# Patient Record
Sex: Female | Born: 1980 | Race: White | Hispanic: No | Marital: Married | Smoking: Never smoker
Health system: Southern US, Community
[De-identification: ages and names within clinical notes are randomized; demographics above are authoritative.]

## PROBLEM LIST (undated history)

## (undated) ENCOUNTER — Inpatient Hospital Stay (HOSPITAL_COMMUNITY): Payer: Self-pay

## (undated) DIAGNOSIS — R112 Nausea with vomiting, unspecified: Secondary | ICD-10-CM

## (undated) DIAGNOSIS — R87619 Unspecified abnormal cytological findings in specimens from cervix uteri: Secondary | ICD-10-CM

## (undated) DIAGNOSIS — Z9889 Other specified postprocedural states: Secondary | ICD-10-CM

## (undated) DIAGNOSIS — IMO0002 Reserved for concepts with insufficient information to code with codable children: Secondary | ICD-10-CM

## (undated) HISTORY — PX: OTHER SURGICAL HISTORY: SHX169

## (undated) HISTORY — PX: WISDOM TOOTH EXTRACTION: SHX21

---

## 2007-12-24 ENCOUNTER — Emergency Department (HOSPITAL_COMMUNITY): Admission: EM | Admit: 2007-12-24 | Discharge: 2007-12-24 | Payer: Self-pay | Admitting: Emergency Medicine

## 2008-04-14 ENCOUNTER — Emergency Department (HOSPITAL_COMMUNITY): Admission: EM | Admit: 2008-04-14 | Discharge: 2008-04-14 | Payer: Self-pay | Admitting: Emergency Medicine

## 2008-09-06 ENCOUNTER — Emergency Department (HOSPITAL_COMMUNITY): Admission: EM | Admit: 2008-09-06 | Discharge: 2008-09-06 | Payer: Self-pay | Admitting: Family Medicine

## 2008-11-10 ENCOUNTER — Emergency Department (HOSPITAL_COMMUNITY): Admission: EM | Admit: 2008-11-10 | Discharge: 2008-11-10 | Payer: Self-pay | Admitting: Emergency Medicine

## 2009-04-28 ENCOUNTER — Emergency Department (HOSPITAL_COMMUNITY): Admission: EM | Admit: 2009-04-28 | Discharge: 2009-04-28 | Payer: Self-pay | Admitting: Family Medicine

## 2010-03-09 ENCOUNTER — Emergency Department (HOSPITAL_COMMUNITY): Admission: EM | Admit: 2010-03-09 | Discharge: 2010-03-09 | Payer: Self-pay | Admitting: Emergency Medicine

## 2011-03-27 LAB — POCT URINALYSIS DIP (DEVICE)
Hgb urine dipstick: NEGATIVE
Ketones, ur: 15 mg/dL — AB
Nitrite: POSITIVE — AB
Protein, ur: 100 mg/dL — AB
Specific Gravity, Urine: 1.015 (ref 1.005–1.030)
Urobilinogen, UA: 4 mg/dL — ABNORMAL HIGH (ref 0.0–1.0)
pH: 5 (ref 5.0–8.0)

## 2011-09-11 LAB — INFLUENZA A AND B ANTIGEN (CONVERTED LAB)
Inflenza A Ag: NEGATIVE
Influenza B Ag: NEGATIVE

## 2011-10-25 ENCOUNTER — Emergency Department (HOSPITAL_COMMUNITY)
Admission: EM | Admit: 2011-10-25 | Discharge: 2011-10-25 | Disposition: A | Discharge status: Home / Self Care | Payer: 59 | Source: Home / Self Care | Attending: Emergency Medicine | Admitting: Emergency Medicine

## 2011-10-25 ENCOUNTER — Encounter: Payer: Self-pay | Admitting: *Deleted

## 2011-10-25 ENCOUNTER — Emergency Department (INDEPENDENT_AMBULATORY_CARE_PROVIDER_SITE_OTHER): Payer: 59

## 2011-10-25 DIAGNOSIS — R071 Chest pain on breathing: Secondary | ICD-10-CM

## 2011-10-25 DIAGNOSIS — J069 Acute upper respiratory infection, unspecified: Secondary | ICD-10-CM

## 2011-10-25 DIAGNOSIS — R0789 Other chest pain: Secondary | ICD-10-CM

## 2011-10-25 DIAGNOSIS — R05 Cough: Secondary | ICD-10-CM

## 2011-10-25 IMAGING — CR DG CHEST 2V
2 series · 2 of 2 positions shown · non-contrast
Comparison: None.

CLINICAL DATA: Cough

CHEST - 2 VIEW

[view not recorded (1 of 2)]
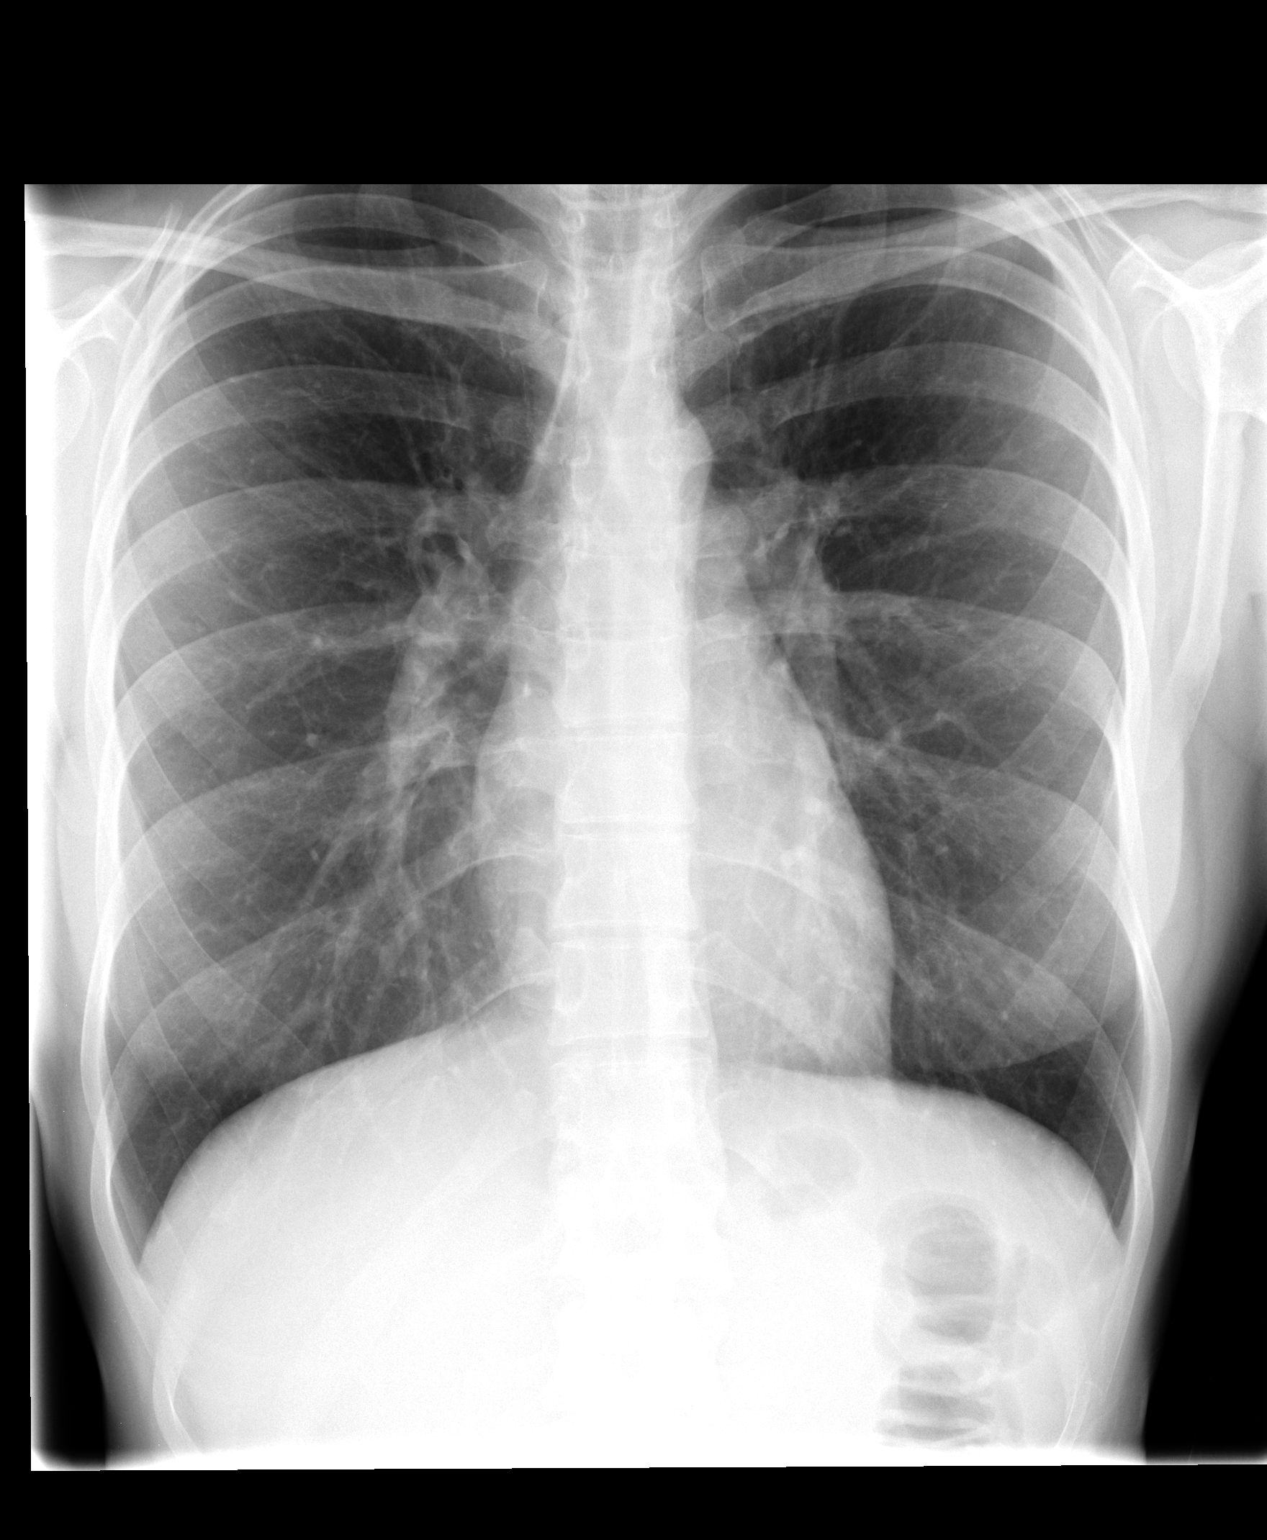

[view not recorded (2 of 2)]
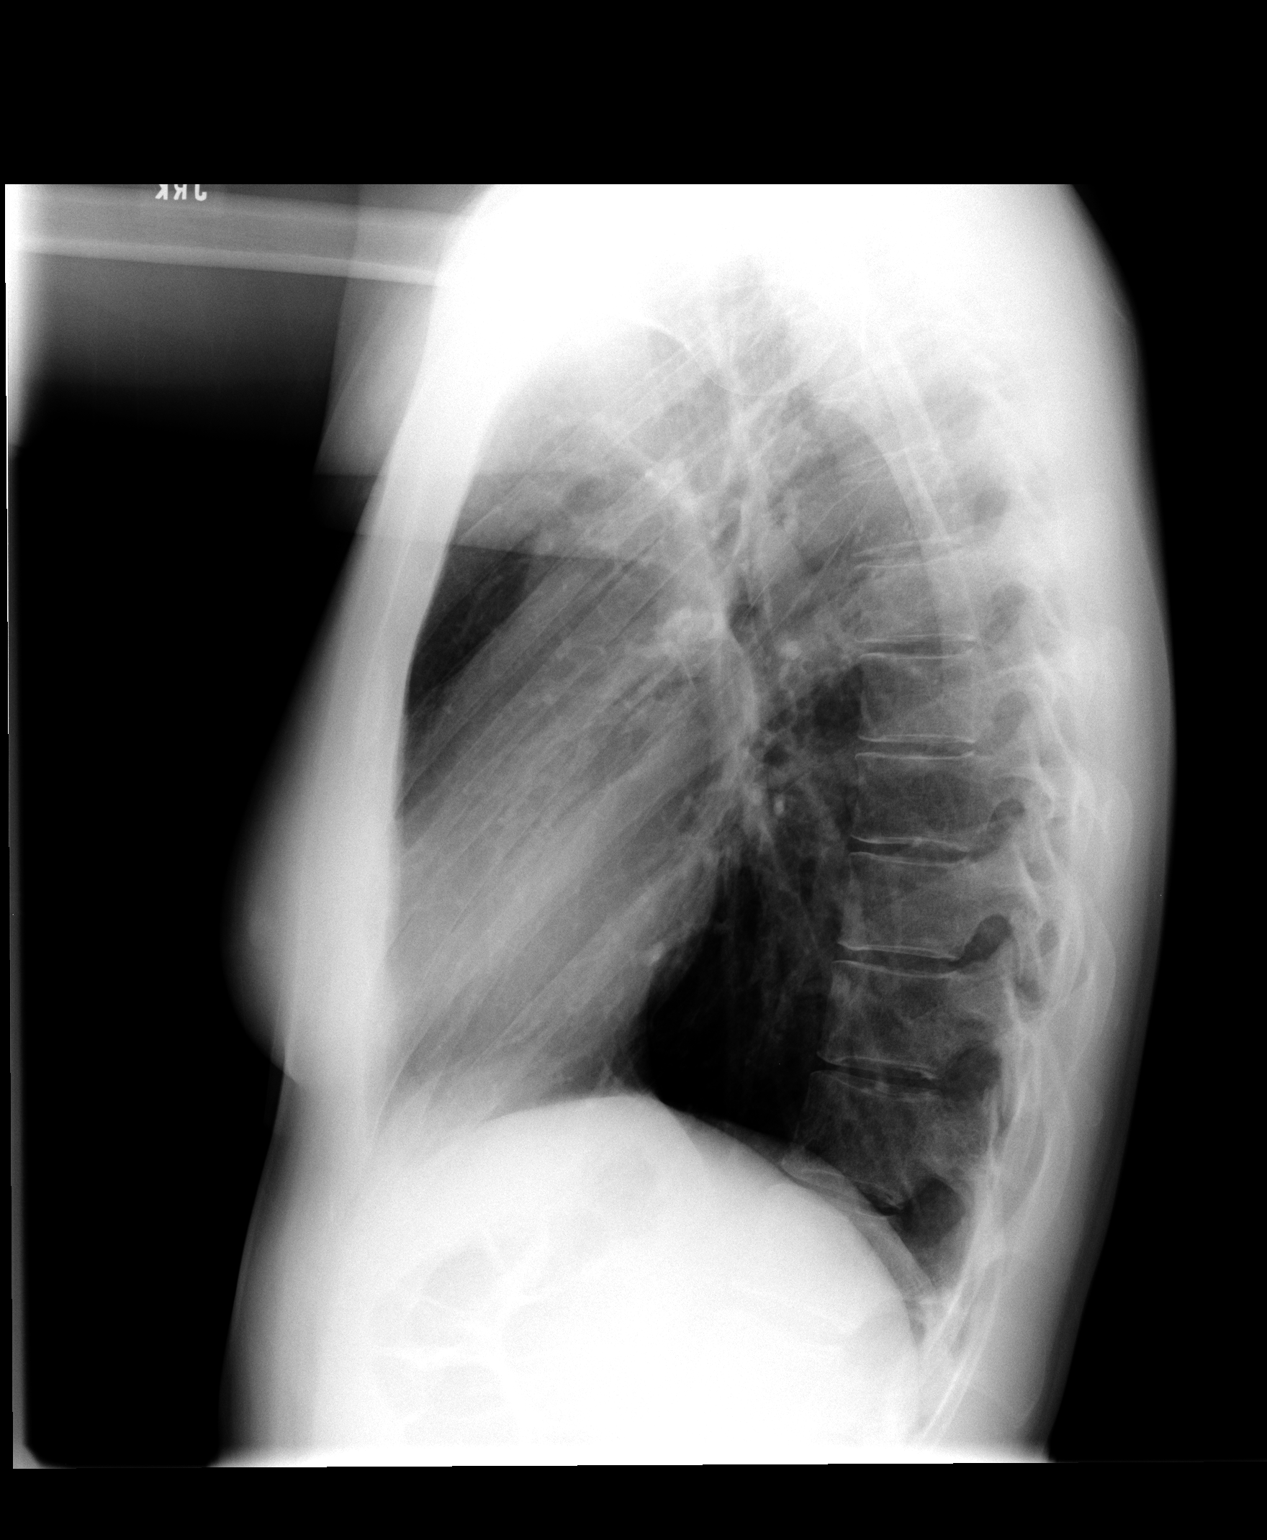

[2 of 2 positions shown; findings below may reference images not displayed]

FINDINGS: Normal heart size.  Hyper aerated and clear lungs.  No
pneumothorax.
IMPRESSION: No active cardiopulmonary disease.

## 2011-10-25 MED ORDER — ACETAMINOPHEN-CODEINE #3 300-30 MG PO TABS: 1.0000 | ORAL_TABLET | Freq: Four times a day (QID) | ORAL | Status: AC | PRN

## 2011-10-25 MED ORDER — AZITHROMYCIN 250 MG PO TABS: 250.0000 mg | ORAL_TABLET | Freq: Every day | ORAL | Status: AC

## 2011-10-25 NOTE — ED Provider Notes (Signed)
History     CSN: [id number] Arrival date & time: 10/25/2011  7:11 PM   First MD Initiated Contact with Patient 10/25/11 1913      Chief Complaint  Patient presents with  . Sore Throat    (Consider location/radiation/quality/duration/timing/severity/associated sxs/prior treatment) Patient is a 29 y.o. female presenting with pharyngitis. The history is provided by the patient. The history is limited by a language barrier.  Sore Throat This is a new problem. The current episode started more than 1 week ago. The problem occurs constantly. The problem has not changed since onset.Associated symptoms include chest pain. Pertinent negatives include no shortness of breath. The symptoms are aggravated by nothing. The symptoms are relieved by nothing. The treatment provided mild relief.    History reviewed. No pertinent past medical history.  History reviewed. No pertinent past surgical history.  Family History  Problem Relation Age of Onset  . Hypertension Father     History  Substance Use Topics  . Smoking status: Never Smoker   . Smokeless tobacco: Not on file  . Alcohol Use: Yes     socially    OB History    Grav Para Term Preterm Abortions TAB SAB Ect Mult Living                  Review of Systems  Constitutional: Positive for fever.  HENT: Positive for congestion. Negative for rhinorrhea.   Respiratory: Positive for cough. Negative for chest tightness, shortness of breath and wheezing.   Cardiovascular: Positive for chest pain.    Allergies  Sulfa antibiotics  Home Medications   Current Outpatient Rx  Name Route Sig Dispense Refill  . ACETAMINOPHEN 325 MG PO TABS Oral Take 650 mg by mouth every 6 (six) hours as needed.      . NYQUIL PO Oral Take by mouth.      . ACETAMINOPHEN-CODEINE #3 300-30 MG PO TABS Oral Take 1-2 tablets by mouth every 6 (six) hours as needed for pain. 15 tablet 0  . AZITHROMYCIN 250 MG PO TABS Oral Take 1 tablet (250 mg total) by mouth  daily. Take first 2 tablets together, then 1 every day until finished. 6 tablet 0    BP 126/78  Pulse 72  Temp(Src) 97.9 F (36.6 C) (Oral)  Resp 18  SpO2 100%  LMP 10/16/2011  Physical Exam  Constitutional: She appears well-developed and well-nourished.  HENT:  Right Ear: Tympanic membrane normal.  Left Ear: Tympanic membrane normal.  Eyes: Pupils are equal, round, and reactive to light.  Neck: Trachea normal and normal range of motion.  Pulmonary/Chest: Effort normal and breath sounds normal. No respiratory distress.    Abdominal: Soft.  Musculoskeletal: Normal range of motion.  Lymphadenopathy:    She has no cervical adenopathy.  Neurological: She is alert.  Skin: Skin is warm.    ED Course  Procedures (including critical care time)  Labs Reviewed - No data to display Dg Chest 2 View  10/25/2011  *RADIOLOGY REPORT*  Clinical Data: Cough  CHEST - 2 VIEW  Comparison: None.  Findings: Normal heart size.  Hyper aerated and clear lungs.  No pneumothorax.  IMPRESSION: No active cardiopulmonary disease.  Original Report Authenticated By: Donavan Burnet, M.D.     1. Upper respiratory infection   2. Cough   3. Chest wall pain       MDM  Cough with pleuritic pain- normal x-rays        Jimmie Molly, MD 10/25/11 2057

## 2011-10-25 NOTE — ED Notes (Signed)
Pt  Reports  sorethroat  Cough  Runny  Nose  Congestion pain l  Side  When takes  Deep  Breath       Cough is    Non productive  At times        Symptoms  X  1  Week

## 2012-02-25 LAB — OB RESULTS CONSOLE HEPATITIS B SURFACE ANTIGEN: Hepatitis B Surface Ag: NEGATIVE

## 2012-02-25 LAB — OB RESULTS CONSOLE ANTIBODY SCREEN: Antibody Screen: NEGATIVE

## 2012-02-25 LAB — OB RESULTS CONSOLE RUBELLA ANTIBODY, IGM: Rubella: IMMUNE

## 2012-02-25 LAB — OB RESULTS CONSOLE ABO/RH

## 2012-02-25 LAB — OB RESULTS CONSOLE HIV ANTIBODY (ROUTINE TESTING): HIV: NONREACTIVE

## 2012-04-05 ENCOUNTER — Inpatient Hospital Stay (HOSPITAL_COMMUNITY): Payer: No Typology Code available for payment source

## 2012-04-05 ENCOUNTER — Inpatient Hospital Stay (HOSPITAL_COMMUNITY)
Admission: AD | Admit: 2012-04-05 | Discharge: 2012-04-05 | Disposition: A | Discharge status: Home / Self Care | Payer: No Typology Code available for payment source | Source: Ambulatory Visit | Attending: Obstetrics and Gynecology | Admitting: Obstetrics and Gynecology

## 2012-04-05 ENCOUNTER — Encounter (HOSPITAL_COMMUNITY): Payer: Self-pay | Admitting: *Deleted

## 2012-04-05 DIAGNOSIS — S3991XA Unspecified injury of abdomen, initial encounter: Secondary | ICD-10-CM

## 2012-04-05 DIAGNOSIS — O9989 Other specified diseases and conditions complicating pregnancy, childbirth and the puerperium: Secondary | ICD-10-CM

## 2012-04-05 DIAGNOSIS — O99891 Other specified diseases and conditions complicating pregnancy: Secondary | ICD-10-CM | POA: Insufficient documentation

## 2012-04-05 DIAGNOSIS — S301XXA Contusion of abdominal wall, initial encounter: Secondary | ICD-10-CM

## 2012-04-05 DIAGNOSIS — R109 Unspecified abdominal pain: Secondary | ICD-10-CM | POA: Insufficient documentation

## 2012-04-05 HISTORY — DX: Unspecified abnormal cytological findings in specimens from cervix uteri: R87.619

## 2012-04-05 HISTORY — DX: Reserved for concepts with insufficient information to code with codable children: IMO0002

## 2012-04-05 LAB — CBC
Hemoglobin: 10.8 g/dL — ABNORMAL LOW (ref 12.0–15.0)
MCH: 29.4 pg (ref 26.0–34.0)
MCHC: 33.2 g/dL (ref 30.0–36.0)
Platelets: 268 10*3/uL (ref 150–400)
RDW: 13.5 % (ref 11.5–15.5)
WBC: 15.5 10*3/uL — ABNORMAL HIGH (ref 4.0–10.5)

## 2012-04-05 LAB — ABO/RH: ABO/RH(D): O POS
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
## 2012-04-05 NOTE — Discharge Instructions (Signed)
Take tylenol as needed for discomfort. Rest and follow up in the office. Return here as needed.  Contusion A contusion is a deep bruise. Contusions happen when an injury causes bleeding under the skin. Signs of bruising include pain, puffiness (swelling), and discolored skin. The contusion may turn blue, purple, or yellow. HOME CARE   Put ice on the injured area.   Put ice in a plastic bag.   Place a towel between your skin and the bag.   Leave the ice on for 15 to 20 minutes, 3 to 4 times a day.   Only take medicine as told by your doctor.   Rest the injured area.   If possible, raise (elevate) the injured area to lessen puffiness.  GET HELP RIGHT AWAY IF:   You have more bruising or puffiness.   You have pain that is getting worse.   Your puffiness or pain is not helped by medicine.  MAKE SURE YOU:   Understand these instructions.   Will watch your condition.   Will get help right away if you are not doing well or get worse.  Document Released: 05/21/2008 Document Revised: 11/22/2011 Document Reviewed: 10/08/2011 [facility] Patient Information 2012 [place], [place].Contusion A contusion is a deep bruise. Contusions happen when an injury causes bleeding under the skin. Signs of bruising include pain, puffiness (swelling), and discolored skin. The contusion may turn blue, purple, or yellow. HOME CARE   Put ice on the injured area.   Put ice in a plastic bag.   Place a towel between your skin and the bag.   Leave the ice on for 15 to 20 minutes, 3 to 4 times a day.   Only take medicine as told by your doctor.   Rest the injured area.   If possible, raise (elevate) the injured area to lessen puffiness.  GET HELP RIGHT AWAY IF:   You have more bruising or puffiness.   You have pain that is getting worse.   Your puffiness or pain is not helped by medicine.  MAKE SURE YOU:   Understand these instructions.   Will watch your condition.   Will get help right away  if you are not doing well or get worse.  Document Released: 05/21/2008 Document Revised: 11/22/2011 Document Reviewed: 10/08/2011 [facility] Patient Information 2012 [place], [place].
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
## 2012-08-20 ENCOUNTER — Ambulatory Visit (HOSPITAL_COMMUNITY)
Admission: RE | Admit: 2012-08-20 | Discharge: 2012-08-20 | Disposition: A | Discharge status: Home / Self Care | Payer: 59 | Source: Ambulatory Visit | Attending: Obstetrics and Gynecology | Admitting: Obstetrics and Gynecology

## 2012-08-20 ENCOUNTER — Other Ambulatory Visit (HOSPITAL_COMMUNITY): Payer: Self-pay | Admitting: Obstetrics and Gynecology

## 2012-08-20 DIAGNOSIS — O4290 Premature rupture of membranes, unspecified as to length of time between rupture and onset of labor, unspecified weeks of gestation: Secondary | ICD-10-CM | POA: Insufficient documentation

## 2012-08-20 DIAGNOSIS — O321XX0 Maternal care for breech presentation, not applicable or unspecified: Secondary | ICD-10-CM | POA: Insufficient documentation

## 2012-08-28 ENCOUNTER — Encounter (HOSPITAL_COMMUNITY): Payer: Self-pay | Admitting: *Deleted

## 2012-08-28 ENCOUNTER — Inpatient Hospital Stay (HOSPITAL_COMMUNITY): Payer: 59

## 2012-08-28 ENCOUNTER — Inpatient Hospital Stay (HOSPITAL_COMMUNITY)
Admission: AD | Admit: 2012-08-28 | Discharge: 2012-08-28 | Disposition: A | Discharge status: Home / Self Care | Payer: 59 | Source: Ambulatory Visit | Attending: Obstetrics and Gynecology | Admitting: Obstetrics and Gynecology

## 2012-08-28 DIAGNOSIS — O99891 Other specified diseases and conditions complicating pregnancy: Secondary | ICD-10-CM | POA: Insufficient documentation

## 2012-08-28 DIAGNOSIS — R109 Unspecified abdominal pain: Secondary | ICD-10-CM | POA: Insufficient documentation

## 2012-08-28 DIAGNOSIS — N949 Unspecified condition associated with female genital organs and menstrual cycle: Secondary | ICD-10-CM | POA: Insufficient documentation

## 2012-08-28 DIAGNOSIS — O4690 Antepartum hemorrhage, unspecified, unspecified trimester: Secondary | ICD-10-CM

## 2012-08-28 NOTE — MAU Note (Signed)
Patient states the baby is breech and will have a cesarean section in 2 weeks.

## 2012-08-28 NOTE — MAU Provider Note (Signed)
  History     CSN: [id number]  Arrival date and time: 08/28/12 4098   First Provider Initiated Contact with Patient 08/28/12 1034      Chief Complaint  Patient presents with  . Vaginal Bleeding   HPI 31 y.o. G1P0000 at [redacted]w[redacted]d with heavy brown vaginal discharge x 1 day. No bright red bleeding. Mild cramping, no other pain. Cervical exam in office yesterday, 1 cm. + fetal movement.    Past Medical History  Diagnosis Date  . Asthma   . Abnormal Pap smear     Past Surgical History  Procedure Date  . Wisdom tooth extraction     Family History  Problem Relation Age of Onset  . Hypertension Father     History  Substance Use Topics  . Smoking status: Never Smoker   . Smokeless tobacco: Not on file  . Alcohol Use: Yes     socially    Allergies:  Allergies  Allergen Reactions  . Sulfa Antibiotics Other (See Comments)    Reaction: childhood    Prescriptions prior to admission  Medication Sig Dispense Refill  . calcium carbonate (TUMS - DOSED IN MG ELEMENTAL CALCIUM) 500 MG chewable tablet Chew 1 tablet by mouth as needed. heartburn      . OVER THE COUNTER MEDICATION Take 1 tablet by mouth as needed. Patient takes over the counter stool softener as needed for constipation      . Prenatal Vit-Fe Fumarate-FA (PRENATAL MULTIVITAMIN) TABS Take 1 tablet by mouth every morning.      Marland Kitchen acetaminophen (TYLENOL) 500 MG tablet Take 1,000 mg by mouth every 6 (six) hours as needed. Takes for pain        Review of Systems  Constitutional: Negative.   Respiratory: Negative.   Cardiovascular: Negative.   Gastrointestinal: Negative for nausea, vomiting, abdominal pain, diarrhea and constipation.  Genitourinary: Negative for dysuria, urgency, frequency, hematuria and flank pain.       + for vaginal discharge/bleeding   Musculoskeletal: Negative.   Neurological: Negative.   Psychiatric/Behavioral: Negative.    Physical Exam   Blood pressure 126/80, temperature 98.7 F (37.1 C),  temperature source Oral, resp. rate 16, height 5' 9.5" (1.765 m), weight 169 lb 9.6 oz (76.93 kg), last menstrual period 10/16/2011, SpO2 100.00%.  Physical Exam  Nursing note and vitals reviewed. Constitutional: She is oriented to person, place, and time. She appears well-developed and well-nourished. No distress.  Cardiovascular: Normal rate.   Respiratory: Effort normal.  GI: Soft. There is no tenderness.  Genitourinary: Vaginal discharge (moderate brown) found.       SVE: 1/50/high  Musculoskeletal: Normal range of motion.  Neurological: She is alert and oriented to person, place, and time.  Skin: Skin is warm and dry.  Psychiatric: She has a normal mood and affect.   EFM reactive, TOCO: UC q2-3 min, irritability  MAU Course  Procedures  U/s: no evidence of abruption, AFV wnl, breech  Assessment and Plan  31 y.o. G1P0000 at [redacted]w[redacted]d with brown vaginal discharge No evidence of abruption, stable maternal fetal status Follow up as scheduled or sooner PRN, precautions rev'd  Sanvi Ehler 08/28/2012, 10:42 AM

## 2012-08-28 NOTE — MAU Note (Signed)
Patient states she was seen in the office 9-11 and had a cervical check and was 1 cm. Started having some dark bleeding that has continued today. Having some mild cramping, reports good fetal movement.

## 2012-09-01 ENCOUNTER — Other Ambulatory Visit: Payer: Self-pay | Admitting: Obstetrics and Gynecology

## 2012-09-01 ENCOUNTER — Encounter (HOSPITAL_COMMUNITY): Payer: Self-pay

## 2012-09-04 ENCOUNTER — Encounter (HOSPITAL_COMMUNITY): Payer: Self-pay

## 2012-09-04 ENCOUNTER — Encounter (HOSPITAL_COMMUNITY)
Admission: RE | Admit: 2012-09-04 | Discharge: 2012-09-04 | Disposition: A | Discharge status: Home / Self Care | Payer: 59 | Source: Ambulatory Visit | Attending: Obstetrics and Gynecology | Admitting: Obstetrics and Gynecology

## 2012-09-04 LAB — CBC
Hemoglobin: 12.4 g/dL (ref 12.0–15.0)
MCH: 30.2 pg (ref 26.0–34.0)
MCV: 90 fL (ref 78.0–100.0)
Platelets: 218 10*3/uL (ref 150–400)
RBC: 4.11 MIL/uL (ref 3.87–5.11)
WBC: 12.8 10*3/uL — ABNORMAL HIGH (ref 4.0–10.5)

## 2012-09-04 NOTE — Patient Instructions (Addendum)
20 Stacy Goodman  09/04/2012   Your procedure is scheduled on:  09/12/12  Enter through the Main Entrance of [facility] at 10 AM.  Pick up the phone at the desk and dial [phone].   Call this number if you have problems the morning of surgery: [phone]   Remember:   Do not eat food:After Midnight.  Do not drink clear liquids: After Midnight.  Take these medicines the morning of surgery with A SIP OF WATER: NA   Do not wear jewelry, make-up or nail polish.  Do not wear lotions, powders, or perfumes. You may wear deodorant.  Do not shave 48 hours prior to surgery.  Do not bring valuables to the hospital.  Contacts, dentures or bridgework may not be worn into surgery.  Leave suitcase in the car. After surgery it may be brought to your room.  For patients admitted to the hospital, checkout time is 11:00 AM the day of discharge.   Patients discharged the day of surgery will not be allowed to drive home.  Name and phone number of your driver: NA  Special Instructions: Shower using CHG 2 nights before surgery and the night before surgery.  If you shower the day of surgery use CHG.  Use special wash - you have one bottle of CHG for all showers.  You should use approximately 1/3 of the bottle for each shower.   Please read over the following fact sheets that you were given: MRSA Information

## 2012-09-05 ENCOUNTER — Inpatient Hospital Stay (HOSPITAL_COMMUNITY): Admission: RE | Admit: 2012-09-05 | Payer: 59 | Source: Ambulatory Visit

## 2012-09-12 ENCOUNTER — Encounter (HOSPITAL_COMMUNITY)
Admission: RE | Disposition: A | Discharge status: Home / Self Care | Payer: Self-pay | Source: Ambulatory Visit | Attending: Obstetrics and Gynecology

## 2012-09-12 ENCOUNTER — Encounter (HOSPITAL_COMMUNITY): Payer: Self-pay | Admitting: General Surgery

## 2012-09-12 ENCOUNTER — Inpatient Hospital Stay (HOSPITAL_COMMUNITY): Payer: 59

## 2012-09-12 ENCOUNTER — Inpatient Hospital Stay (HOSPITAL_COMMUNITY)
Admission: RE | Admit: 2012-09-12 | Discharge: 2012-09-15 | DRG: 766 | Disposition: A | Discharge status: Home / Self Care | Payer: 59 | Source: Ambulatory Visit | Attending: Obstetrics and Gynecology | Admitting: Obstetrics and Gynecology

## 2012-09-12 ENCOUNTER — Encounter (HOSPITAL_COMMUNITY): Payer: Self-pay | Admitting: Anesthesiology

## 2012-09-12 ENCOUNTER — Encounter (HOSPITAL_COMMUNITY): Payer: Self-pay

## 2012-09-12 DIAGNOSIS — Z3480 Encounter for supervision of other normal pregnancy, unspecified trimester: Secondary | ICD-10-CM

## 2012-09-12 DIAGNOSIS — O321XX0 Maternal care for breech presentation, not applicable or unspecified: Principal | ICD-10-CM | POA: Diagnosis present

## 2012-09-12 HISTORY — DX: Other specified postprocedural states: R11.2

## 2012-09-12 HISTORY — DX: Other specified postprocedural states: Z98.890

## 2012-09-12 SURGERY — Surgical Case
Anesthesia: Spinal | Site: Abdomen | Wound class: Clean Contaminated

## 2012-09-12 MED ORDER — KETOROLAC TROMETHAMINE 30 MG/ML IJ SOLN
30.0000 mg | Freq: Four times a day (QID) | INTRAMUSCULAR | Status: AC | PRN
  Administered 2012-09-12: 30 mg via INTRAVENOUS
  Filled 2012-09-12: qty 1

## 2012-09-12 MED ORDER — TETANUS-DIPHTH-ACELL PERTUSSIS 5-2.5-18.5 LF-MCG/0.5 IM SUSP: 0.5000 mL | Freq: Once | INTRAMUSCULAR | Status: DC

## 2012-09-12 MED ORDER — SCOPOLAMINE 1 MG/3DAYS TD PT72
1.0000 | MEDICATED_PATCH | Freq: Once | TRANSDERMAL | Status: DC
  Filled 2012-09-12: qty 1

## 2012-09-12 MED ORDER — OXYTOCIN 40 UNITS IN LACTATED RINGERS INFUSION - SIMPLE MED: 62.5000 mL/h | INTRAVENOUS | Status: AC

## 2012-09-12 MED ORDER — CEFAZOLIN SODIUM-DEXTROSE 2-3 GM-% IV SOLR
2.0000 g | INTRAVENOUS | Status: AC
  Administered 2012-09-12: 2 g via INTRAVENOUS

## 2012-09-12 MED ORDER — FENTANYL CITRATE 0.05 MG/ML IJ SOLN
INTRAMUSCULAR | Status: AC
  Filled 2012-09-12: qty 2

## 2012-09-12 MED ORDER — EPHEDRINE 5 MG/ML INJ
INTRAVENOUS | Status: AC
  Filled 2012-09-12: qty 10

## 2012-09-12 MED ORDER — ONDANSETRON HCL 4 MG/2ML IJ SOLN
INTRAMUSCULAR | Status: AC
  Filled 2012-09-12: qty 2

## 2012-09-12 MED ORDER — HYDROMORPHONE HCL PF 1 MG/ML IJ SOLN: 0.2500 mg | INTRAMUSCULAR | Status: DC | PRN

## 2012-09-12 MED ORDER — KETOROLAC TROMETHAMINE 60 MG/2ML IM SOLN
60.0000 mg | Freq: Once | INTRAMUSCULAR | Status: AC | PRN
  Administered 2012-09-12: 60 mg via INTRAMUSCULAR

## 2012-09-12 MED ORDER — LACTATED RINGERS IV SOLN
INTRAVENOUS | Status: DC | PRN
  Administered 2012-09-12 (×2): via INTRAVENOUS

## 2012-09-12 MED ORDER — SENNOSIDES-DOCUSATE SODIUM 8.6-50 MG PO TABS
2.0000 | ORAL_TABLET | Freq: Every day | ORAL | Status: DC
  Administered 2012-09-12 – 2012-09-14 (×3): 2 via ORAL

## 2012-09-12 MED ORDER — PHENYLEPHRINE 40 MCG/ML (10ML) SYRINGE FOR IV PUSH (FOR BLOOD PRESSURE SUPPORT)
PREFILLED_SYRINGE | INTRAVENOUS | Status: AC
  Filled 2012-09-12: qty 5

## 2012-09-12 MED ORDER — KETOROLAC TROMETHAMINE 30 MG/ML IJ SOLN: 30.0000 mg | Freq: Four times a day (QID) | INTRAMUSCULAR | Status: AC | PRN

## 2012-09-12 MED ORDER — PRENATAL MULTIVITAMIN CH
1.0000 | ORAL_TABLET | Freq: Every morning | ORAL | Status: DC
  Administered 2012-09-13 – 2012-09-15 (×3): 1 via ORAL
  Filled 2012-09-12 (×4): qty 1

## 2012-09-12 MED ORDER — DIPHENHYDRAMINE HCL 25 MG PO CAPS: 25.0000 mg | ORAL_CAPSULE | ORAL | Status: DC | PRN

## 2012-09-12 MED ORDER — NALBUPHINE HCL 10 MG/ML IJ SOLN
5.0000 mg | INTRAMUSCULAR | Status: DC | PRN
  Filled 2012-09-12: qty 1

## 2012-09-12 MED ORDER — SODIUM CHLORIDE 0.9 % IJ SOLN: 3.0000 mL | INTRAMUSCULAR | Status: DC | PRN

## 2012-09-12 MED ORDER — SCOPOLAMINE 1 MG/3DAYS TD PT72
MEDICATED_PATCH | TRANSDERMAL | Status: AC
  Administered 2012-09-12: 1.5 mg via INTRADERMAL
  Filled 2012-09-12: qty 1

## 2012-09-12 MED ORDER — ONDANSETRON HCL 4 MG/2ML IJ SOLN: 4.0000 mg | Freq: Three times a day (TID) | INTRAMUSCULAR | Status: DC | PRN

## 2012-09-12 MED ORDER — NALOXONE HCL 0.4 MG/ML IJ SOLN: 0.4000 mg | INTRAMUSCULAR | Status: DC | PRN

## 2012-09-12 MED ORDER — OXYTOCIN 10 UNIT/ML IJ SOLN
INTRAMUSCULAR | Status: AC
  Filled 2012-09-12: qty 1

## 2012-09-12 MED ORDER — ONDANSETRON HCL 4 MG/2ML IJ SOLN
INTRAMUSCULAR | Status: DC | PRN
  Administered 2012-09-12: 4 mg via INTRAVENOUS

## 2012-09-12 MED ORDER — PHENYLEPHRINE 40 MCG/ML (10ML) SYRINGE FOR IV PUSH (FOR BLOOD PRESSURE SUPPORT)
PREFILLED_SYRINGE | INTRAVENOUS | Status: AC
Start: 2012-09-12 — End: 2012-09-12
  Filled 2012-09-12: qty 5

## 2012-09-12 MED ORDER — CEFAZOLIN SODIUM-DEXTROSE 2-3 GM-% IV SOLR
INTRAVENOUS | Status: AC
  Filled 2012-09-12: qty 50

## 2012-09-12 MED ORDER — KETOROLAC TROMETHAMINE 60 MG/2ML IM SOLN
INTRAMUSCULAR | Status: AC
  Administered 2012-09-12: 60 mg via INTRAMUSCULAR
  Filled 2012-09-12: qty 2

## 2012-09-12 MED ORDER — METOCLOPRAMIDE HCL 5 MG/ML IJ SOLN: 10.0000 mg | Freq: Three times a day (TID) | INTRAMUSCULAR | Status: DC | PRN

## 2012-09-12 MED ORDER — TETANUS-DIPHTH-ACELL PERTUSSIS 5-2.5-18.5 LF-MCG/0.5 IM SUSP
0.5000 mL | Freq: Once | INTRAMUSCULAR | Status: AC
  Administered 2012-09-14: 0.5 mL via INTRAMUSCULAR
  Filled 2012-09-12: qty 0.5

## 2012-09-12 MED ORDER — DIBUCAINE 1 % RE OINT: 1.0000 "application " | TOPICAL_OINTMENT | RECTAL | Status: DC | PRN

## 2012-09-12 MED ORDER — MEPERIDINE HCL 25 MG/ML IJ SOLN: 6.2500 mg | INTRAMUSCULAR | Status: DC | PRN

## 2012-09-12 MED ORDER — IBUPROFEN 600 MG PO TABS
600.0000 mg | ORAL_TABLET | Freq: Four times a day (QID) | ORAL | Status: DC
  Administered 2012-09-13 – 2012-09-15 (×9): 600 mg via ORAL

## 2012-09-12 MED ORDER — OXYTOCIN 10 UNIT/ML IJ SOLN
40.0000 [IU] | INTRAVENOUS | Status: DC | PRN
  Administered 2012-09-12: 40 [IU] via INTRAVENOUS

## 2012-09-12 MED ORDER — IBUPROFEN 600 MG PO TABS
600.0000 mg | ORAL_TABLET | Freq: Four times a day (QID) | ORAL | Status: DC | PRN
  Filled 2012-09-12 (×9): qty 1

## 2012-09-12 MED ORDER — PHENYLEPHRINE HCL 10 MG/ML IJ SOLN
INTRAMUSCULAR | Status: DC | PRN
  Administered 2012-09-12 (×2): 80 ug via INTRAVENOUS

## 2012-09-12 MED ORDER — SODIUM CHLORIDE 0.9 % IV SOLN
1.0000 ug/kg/h | INTRAVENOUS | Status: DC | PRN
  Filled 2012-09-12: qty 2.5

## 2012-09-12 MED ORDER — HYDROMORPHONE HCL PF 1 MG/ML IJ SOLN
1.0000 mg | Freq: Once | INTRAMUSCULAR | Status: AC
  Administered 2012-09-12: 1 mg via INTRAVENOUS
  Filled 2012-09-12: qty 1

## 2012-09-12 MED ORDER — MENTHOL 3 MG MT LOZG: 1.0000 | LOZENGE | OROMUCOSAL | Status: DC | PRN

## 2012-09-12 MED ORDER — ONDANSETRON HCL 4 MG PO TABS: 4.0000 mg | ORAL_TABLET | ORAL | Status: DC | PRN

## 2012-09-12 MED ORDER — WITCH HAZEL-GLYCERIN EX PADS: 1.0000 "application " | MEDICATED_PAD | CUTANEOUS | Status: DC | PRN

## 2012-09-12 MED ORDER — LACTATED RINGERS IV SOLN
INTRAVENOUS | Status: DC
  Administered 2012-09-12: 21:00:00 via INTRAVENOUS

## 2012-09-12 MED ORDER — INFLUENZA VIRUS VACC SPLIT PF IM SUSP
0.5000 mL | INTRAMUSCULAR | Status: AC
Start: 2012-09-13 — End: 2012-09-14

## 2012-09-12 MED ORDER — LANOLIN HYDROUS EX OINT: 1.0000 "application " | TOPICAL_OINTMENT | CUTANEOUS | Status: DC | PRN

## 2012-09-12 MED ORDER — ZOLPIDEM TARTRATE 5 MG PO TABS: 5.0000 mg | ORAL_TABLET | Freq: Every evening | ORAL | Status: DC | PRN

## 2012-09-12 MED ORDER — DIPHENHYDRAMINE HCL 25 MG PO CAPS: 25.0000 mg | ORAL_CAPSULE | Freq: Four times a day (QID) | ORAL | Status: DC | PRN

## 2012-09-12 MED ORDER — MORPHINE SULFATE 0.5 MG/ML IJ SOLN
INTRAMUSCULAR | Status: AC
  Filled 2012-09-12: qty 10

## 2012-09-12 MED ORDER — LACTATED RINGERS IV SOLN
INTRAVENOUS | Status: DC | PRN
  Administered 2012-09-12: 12:00:00 via INTRAVENOUS

## 2012-09-12 MED ORDER — LACTATED RINGERS IV SOLN
INTRAVENOUS | Status: DC
  Administered 2012-09-12: 125 mL/h via INTRAVENOUS

## 2012-09-12 MED ORDER — ONDANSETRON HCL 4 MG/2ML IJ SOLN: 4.0000 mg | INTRAMUSCULAR | Status: DC | PRN

## 2012-09-12 MED ORDER — SIMETHICONE 80 MG PO CHEW: 80.0000 mg | CHEWABLE_TABLET | ORAL | Status: DC | PRN

## 2012-09-12 MED ORDER — SIMETHICONE 80 MG PO CHEW
80.0000 mg | CHEWABLE_TABLET | Freq: Three times a day (TID) | ORAL | Status: DC
  Administered 2012-09-12 – 2012-09-15 (×10): 80 mg via ORAL

## 2012-09-12 MED ORDER — PRENATAL MULTIVITAMIN CH: 1.0000 | ORAL_TABLET | Freq: Every day | ORAL | Status: DC

## 2012-09-12 MED ORDER — DIPHENHYDRAMINE HCL 50 MG/ML IJ SOLN: 12.5000 mg | INTRAMUSCULAR | Status: DC | PRN

## 2012-09-12 MED ORDER — OXYCODONE-ACETAMINOPHEN 5-325 MG PO TABS
1.0000 | ORAL_TABLET | ORAL | Status: DC | PRN
Start: 2012-09-12 — End: 2012-09-15
  Administered 2012-09-13 (×3): 1 via ORAL
  Administered 2012-09-13 (×2): 2 via ORAL
  Administered 2012-09-13 (×2): 1 via ORAL
  Administered 2012-09-14 – 2012-09-15 (×9): 2 via ORAL
  Filled 2012-09-12: qty 1
  Filled 2012-09-12: qty 2
  Filled 2012-09-12: qty 1
  Filled 2012-09-12 (×7): qty 2
  Filled 2012-09-12: qty 1
  Filled 2012-09-12 (×4): qty 2

## 2012-09-12 MED ORDER — GLYCOPYRROLATE 0.2 MG/ML IJ SOLN
INTRAMUSCULAR | Status: DC | PRN
  Administered 2012-09-12: 0.2 mg via INTRAVENOUS

## 2012-09-12 MED ORDER — DIPHENHYDRAMINE HCL 50 MG/ML IJ SOLN: 25.0000 mg | INTRAMUSCULAR | Status: DC | PRN

## 2012-09-12 SURGICAL SUPPLY — 31 items
CLOTH BEACON ORANGE TIMEOUT ST (SAFETY) ×2 IMPLANT
DRAPE SURG 17X23 STRL (DRAPES) ×2 IMPLANT
DRESSING TELFA 8X3 (GAUZE/BANDAGES/DRESSINGS) ×2 IMPLANT
DRSG COVADERM 4X10 (GAUZE/BANDAGES/DRESSINGS) ×2 IMPLANT
DURAPREP 26ML APPLICATOR (WOUND CARE) ×2 IMPLANT
ELECT REM PT RETURN 9FT ADLT (ELECTROSURGICAL) ×2
ELECTRODE REM PT RTRN 9FT ADLT (ELECTROSURGICAL) ×1 IMPLANT
EXTRACTOR VACUUM M CUP 4 TUBE (SUCTIONS) IMPLANT
GAUZE SPONGE 4X4 12PLY STRL LF (GAUZE/BANDAGES/DRESSINGS) ×4 IMPLANT
GLOVE BIO SURGEON STRL SZ 6.5 (GLOVE) ×2 IMPLANT
GLOVE BIOGEL PI IND STRL 6.5 (GLOVE) ×2 IMPLANT
GLOVE BIOGEL PI INDICATOR 6.5 (GLOVE) ×2
GOWN PREVENTION PLUS LG XLONG (DISPOSABLE) ×4 IMPLANT
KIT ABG SYR 3ML LUER SLIP (SYRINGE) IMPLANT
NEEDLE HYPO 25X5/8 SAFETYGLIDE (NEEDLE) IMPLANT
NS IRRIG 1000ML POUR BTL (IV SOLUTION) ×2 IMPLANT
PACK C SECTION WH (CUSTOM PROCEDURE TRAY) ×2 IMPLANT
PAD ABD 7.5X8 STRL (GAUZE/BANDAGES/DRESSINGS) IMPLANT
PAD OB MATERNITY 4.3X12.25 (PERSONAL CARE ITEMS) IMPLANT
RTRCTR C-SECT PINK 25CM LRG (MISCELLANEOUS) ×2 IMPLANT
SLEEVE SCD COMPRESS KNEE MED (MISCELLANEOUS) IMPLANT
STAPLER VISISTAT 35W (STAPLE) IMPLANT
SUT MON AB 2-0 CT1 27 (SUTURE) ×2 IMPLANT
SUT MON AB 4-0 PS1 27 (SUTURE) IMPLANT
SUT PLAIN 2 0 XLH (SUTURE) IMPLANT
SUT VIC AB 0 CTX 36 (SUTURE) ×4
SUT VIC AB 0 CTX36XBRD ANBCTRL (SUTURE) ×4 IMPLANT
SUT VIC AB 4-0 KS 27 (SUTURE) IMPLANT
TOWEL OR 17X24 6PK STRL BLUE (TOWEL DISPOSABLE) ×4 IMPLANT
TRAY FOLEY CATH 14FR (SET/KITS/TRAYS/PACK) ×2 IMPLANT
WATER STERILE IRR 1000ML POUR (IV SOLUTION) ×2 IMPLANT

## 2012-09-12 NOTE — Anesthesia Procedure Notes (Signed)
Spinal  Patient location during procedure: OR Preanesthetic Checklist Completed: patient identified, site marked, surgical consent, pre-op evaluation, timeout performed, IV checked, risks and benefits discussed and monitors and equipment checked Spinal Block Patient position: sitting Prep: DuraPrep Patient monitoring: heart rate, cardiac monitor, continuous pulse ox and blood pressure Approach: midline Location: L3-4 Injection technique: single-shot Needle Needle type: Sprotte  Needle gauge: 24 G Needle length: 9 cm Assessment Sensory level: T4 Additional Notes Spinal Dosage in OR  Bupivicaine ml       1.9 PFMS04   mcg        150 Fentanyl mcg            25    

## 2012-09-12 NOTE — Anesthesia Preprocedure Evaluation (Signed)
Anesthesia Evaluation  Patient identified by MRN, date of birth, ID band Patient awake    Reviewed: Allergy & Precautions, H&P , Patient's Chart, lab work & pertinent test results  Airway Mallampati: II TM Distance: >3 FB Neck ROM: full    Dental No notable dental hx.    Pulmonary asthma (rare inhaler use) ,  breath sounds clear to auscultation  Pulmonary exam normal       Cardiovascular Exercise Tolerance: Good Rhythm:regular Rate:Normal     Neuro/Psych    GI/Hepatic   Endo/Other    Renal/GU      Musculoskeletal   Abdominal   Peds  Hematology   Anesthesia Other Findings   Reproductive/Obstetrics                           Anesthesia Physical Anesthesia Plan  ASA: II  Anesthesia Plan: Spinal   Post-op Pain Management:    Induction:   Airway Management Planned:   Additional Equipment:   Intra-op Plan:   Post-operative Plan:   Informed Consent: I have reviewed the patients History and Physical, chart, labs and discussed the procedure including the risks, benefits and alternatives for the proposed anesthesia with the patient or authorized representative who has indicated his/her understanding and acceptance.   Dental Advisory Given  Plan Discussed with: CRNA  Anesthesia Plan Comments: (Lab work confirmed with CRNA in room. Platelets okay. Discussed spinal anesthetic, and patient consents to the procedure:  included risk of possible headache,backache, failed block, allergic reaction, and nerve injury. This patient was asked if she had any questions or concerns before the procedure started. )        Anesthesia Quick Evaluation

## 2012-09-12 NOTE — Anesthesia Postprocedure Evaluation (Signed)
  Anesthesia Post-op Note  Patient: Stacy Goodman  Procedure(s) Performed: Procedure(s) (LRB) with comments: CESAREAN SECTION (N/A)  Patient Location: Mother/Baby  Anesthesia Type: Spinal  Level of Consciousness: awake  Airway and Oxygen Therapy: Patient Spontanous Breathing  Post-op Pain: mild  Post-op Assessment: Patient's Cardiovascular Status Stable and Respiratory Function Stable  Post-op Vital Signs: stable  Complications: No apparent anesthesia complications

## 2012-09-12 NOTE — Transfer of Care (Signed)
Immediate Anesthesia Transfer of Care Note  Patient: Stacy Goodman  Procedure(s) Performed: Procedure(s) (LRB) with comments: CESAREAN SECTION (N/A)  Patient Location: PACU  Anesthesia Type: Spinal  Level of Consciousness: awake, alert  and oriented  Airway & Oxygen Therapy: Patient Spontanous Breathing  Post-op Assessment: Report given to PACU RN and Post -op Vital signs reviewed and stable  Post vital signs: stable  Complications: No apparent anesthesia complications

## 2012-09-12 NOTE — Op Note (Signed)
Preop: 39.3 breech Postop: same Procedure: LTCS Surgeon: Delray Alt Assist: A. Ross Anesthesia: spinal EBL 600cc Fluids: 3000cc UOP: 200cc - clear Specimen: cord blood Findings: female, breech, nl tubes and ovaries Complications: none Condition: stable to PACU

## 2012-09-12 NOTE — H&P (Signed)
31 y.o. [redacted]w[redacted]d  G1P0000 comes infor schedule c/s due to breech presenatation, declining version.  Otherwise has good fetal movement and no bleeding.  Past Medical History  Diagnosis Date  . Asthma   . Abnormal Pap smear   . PONV (postoperative nausea and vomiting)     Past Surgical History  Procedure Date  . Wisdom tooth extraction   . No past surgeries   . Tubes in ears     OB History    Grav Para Term Preterm Abortions TAB SAB Ect Mult Living   1 0 0 0 0 0 0 0 0 0      # Outc Date GA Lbr Len/2nd Wgt Sex Del Anes PTL Lv   1 CUR               History   Social History  . Marital Status: Married    Spouse Name: N/A    Number of Children: N/A  . Years of Education: N/A   Occupational History  . Not on file.   Social History Main Topics  . Smoking status: Never Smoker   . Smokeless tobacco: Not on file  . Alcohol Use: Yes     socially  . Drug Use: No  . Sexually Active: Yes    Birth Control/ Protection: None   Other Topics Concern  . Not on file   Social History Narrative  . No narrative on file   Sulfa antibiotics   Prenatal Course:  Stable asthm  Filed Vitals:   09/12/12 1009  BP: 133/88  Pulse: 84  Temp: 98.1 F (36.7 C)  Resp: 17     Lungs/Cor:  NAD Abdomen:  soft, gravid Ex:  no cords, erythema SVE:  1-2/30/-2 last OV    A/P   Admit for schedule prime c/s Ancef 2g IV Other routine preop care  GBS Neg  Niambi Smoak

## 2012-09-12 NOTE — Consult Note (Addendum)
Neonatology Note:  Attendance at C-section:  I was asked to attend this primary C/S at term. The mother is a G1P0 O pos, GBS unknown with breech presentation. ROM at delivery, fluid clear. Infant vigorous with good spontaneous cry and tone. Needed only minimal bulb suctioning. Ap 9/10. Lungs clear to ausc in DR. To CN to care of Pediatrician.  Makia Bossi, MD  

## 2012-09-13 LAB — CBC
MCH: 29.9 pg (ref 26.0–34.0)
MCHC: 33.2 g/dL (ref 30.0–36.0)
Platelets: 176 10*3/uL (ref 150–400)

## 2012-09-13 NOTE — Op Note (Signed)
Stacy Goodman, Stacy Goodman NO.:  [id number]  MEDICAL RECORD NO.:  [id number]  LOCATION:  9146                          FACILITY:  [facility]  PHYSICIAN:  Philip Aspen, DO    DATE OF BIRTH:  [birth year]  DATE OF PROCEDURE:  09/12/2012 DATE OF DISCHARGE:                              OPERATIVE REPORT   PREOPERATIVE DIAGNOSIS:  Breech presentation, term intrauterine pregnancy.  POSTOPERATIVE DIAGNOSIS:  Breech presentation, term intrauterine pregnancy.  PROCEDURE:  Low-transverse cesarean section.  SURGEON:  Philip Aspen, DO  ASSISTANT:  Miguel Aschoff, MD  ANESTHESIA:  Spinal.  ESTIMATED BLOOD LOSS:  600 mL.  FLUIDS:  3000 mL.  URINE OUTPUT:  200 mL, clear.  SPECIMENS:  Cord blood.  FINDINGS:  Female infant in breech presentation with Apgars of 9 and 9. Weight pending.  COMPLICATIONS:  None.  CONDITION:  Stable to PACU.  DESCRIPTION OF PROCEDURE:  The patient was taken to the operating room where spinal anesthetic was administered and found to be adequate.  The patient was then prepped and draped in the normal sterile fashion in dorsal supine position with a leftward tilt.  A Pfannenstiel skin incision was made with scalpel and carried through the underlying layer of fascia with the Bovie cautery.  The fascia was then incised with the scalpel and extended laterally with Mayo scissors.  Kocher clamps were placed superiorly and inferiorly following the fascial line where it was tented and rectus muscles were dissected off bluntly and sharply.  An Pharmacologist was placed.  The abdominal cavity and extent of uterus was explored manually.  A low-transverse cesarean incision was made and extended laterally by cephalic and caudal traction.  The infant's hips were located, delivered followed by both legs.  The body was then rotated from left to right while sweeping the arms forward and down.  The head was kept in flexed position and delivered  without difficulty.  The nose and mouth were bulb suctioned.  The cord was clamped and cut and baby was handed off to the awaiting neonatology. Cord blood was to be collected.  The uterus was externally massaged with gentle traction on the cord and it was handed off to the cord blood technician.  The uterus was then cleared of all clot and debris, and a 0 Vicryl suture was used to close the uterine incision in a locked fashion.  A second layer of horizontal Lembert imbrication was performed.  No further bleeding along the incision line was noted. Blood was removed from both gutters and no additional active bleeding was noted.  Both ovaries and tubes were examined and found to be normal. The peritoneal incision was then reapproximated and closed with Monocryl in a running fashion from superior to inferior and then several bites of the rectus muscle were reapproximated going from inferior to superior. Prior to closure of the perineal incision, Interceed was placed half piece along the uterine incision and a second piece extending in a T cephalically.  After the rectus muscle was reapproximated, the fascia was reapproximated and closed with Vicryl in a running fashion.  The subcutaneous tissue was irrigated and dried and found to be hemostatic. The skin was then  reapproximated and closed with staples.  The patient tolerated the procedure well.  Sponge, lap, and needle counts were correct x2.  The patient was taken to recovery room in stable condition.          ______________________________ Philip Aspen, DO     Garber/MEDQ  D:  09/12/2012  T:  09/13/2012  Job:  [id number]

## 2012-09-13 NOTE — Progress Notes (Signed)
Post Op Day 1 Subjective: no complaints  Objective: Blood pressure 104/51, pulse 76, temperature 98.5 F (36.9 C), temperature source Oral, resp. rate 18, height 5' 9.5" (1.765 m), weight 76.658 kg (169 lb), last menstrual period 10/16/2011, SpO2 96.00%, unknown if currently breastfeeding.  Physical Exam:  General: alert Lochia: appropriate Uterine Fundus: firm Incision: no significant drainage   Basename 09/13/12 0500  HGB 9.8*  HCT 29.5*    Assessment/Plan: Continue observation   LOS: 1 day   Stacy Goodman D 09/13/2012, 8:38 AM

## 2012-09-14 MED ORDER — INFLUENZA VIRUS VACC SPLIT PF IM SUSP
0.5000 mL | INTRAMUSCULAR | Status: AC
  Administered 2012-09-15: 0.5 mL via INTRAMUSCULAR

## 2012-09-14 NOTE — Progress Notes (Signed)
Post Op Day 2 Subjective: no complaints, voiding, tolerating PO and + flatus  Objective: Blood pressure 112/71, pulse 79, temperature 97.7 F (36.5 C), breastfeeding.  Physical Exam:  General: alert Lochia: appropriate Uterine Fundus: firm Incision: healing well   Basename 09/13/12 0500  HGB 9.8*  HCT 29.5*    Assessment/Plan: Plan for discharge tomorrow.  Patient states she is prone to keloid; will remove staples today to decrease wound stress and hopefully decrease risk of keloid.   LOS: 2 days   Mickel Baas 09/14/2012, 10:41 AM

## 2012-09-15 ENCOUNTER — Encounter (HOSPITAL_COMMUNITY): Payer: Self-pay | Admitting: Obstetrics and Gynecology

## 2012-09-15 MED ORDER — OXYCODONE-ACETAMINOPHEN 5-325 MG PO TABS: 1.0000 | ORAL_TABLET | ORAL | Status: AC | PRN

## 2012-09-15 NOTE — Progress Notes (Signed)
POD#3 Pt doing well. Ready for discharge. PLAN/ Discharge.

## 2012-09-15 NOTE — Discharge Summary (Signed)
Stacy Goodman, Stacy Goodman NO.:  [id number]  MEDICAL RECORD NO.:  [id number]  LOCATION:  9146                          FACILITY:  [facility]  PHYSICIAN:  Malva Limes, M.D.    DATE OF BIRTH:  06/16/1981  DATE OF ADMISSION:  09/12/2012 DATE OF DISCHARGE:  09/15/2012                              DISCHARGE SUMMARY   PRINCIPAL DISCHARGE DIAGNOSES: 1. Intrauterine pregnancy at term. 2. Persistent breech presentation 3. The patient declined attempt at external version.  PRINCIPAL PROCEDURES:  Primary low transverse cesarean section.  HISTORY OF PRESENT ILLNESS:  [name] is a 31 year old white female, G1 P0 at term, who was admitted by Dr. Claiborne Billings for a primary cesarean section secondary to persistent breech presentation.  The patient's prenatal care was otherwise uncomplicated.  She was offered an attempt at external version and declined.  HOSPITAL COURSE:  The patient was admitted.  She underwent a primary cesarean section.  The details of this procedure can be found in dictated operative note.  The patient's postop operative course was benign.  The patient's postop hemoglobin was 9.8.  The patient was discharged to home.  At the time of discharge, she was eating a regular diet.  She was ambulating without difficulty.  She was afebrile.  Her incision appeared to be healing well.  She was sent home with Percocet to take p.r.n.  She was told to follow up in the office in 4 weeks.          ______________________________ Malva Limes, M.D.     MA/MEDQ  D:  09/15/2012  T:  09/15/2012  Job:  [id number]

## 2012-12-19 ENCOUNTER — Emergency Department (INDEPENDENT_AMBULATORY_CARE_PROVIDER_SITE_OTHER)
Admission: EM | Admit: 2012-12-19 | Discharge: 2012-12-19 | Disposition: A | Discharge status: Home / Self Care | Payer: 59 | Source: Home / Self Care | Attending: Family Medicine | Admitting: Family Medicine

## 2012-12-19 ENCOUNTER — Encounter: Payer: Self-pay | Admitting: *Deleted

## 2012-12-19 DIAGNOSIS — J069 Acute upper respiratory infection, unspecified: Secondary | ICD-10-CM

## 2012-12-19 DIAGNOSIS — J45909 Unspecified asthma, uncomplicated: Secondary | ICD-10-CM

## 2012-12-19 MED ORDER — AZITHROMYCIN 250 MG PO TABS: ORAL_TABLET | ORAL | Status: DC

## 2012-12-19 MED ORDER — METHYLPREDNISOLONE ACETATE 80 MG/ML IJ SUSP
80.0000 mg | Freq: Once | INTRAMUSCULAR | Status: AC
  Administered 2012-12-19: 80 mg via INTRAMUSCULAR

## 2012-12-19 NOTE — ED Provider Notes (Signed)
History     CSN: [id number]  Arrival date & time 12/19/12  1343   First MD Initiated Contact with Patient 12/19/12 1426      Chief Complaint  Patient presents with  . Cough  . Nasal Congestion   HPI  URI Symptoms Onset: 7-10 days  Description: rhinorrhea, nasal congestion, cough, wheezing Modifying factors:  Baseline hx/o asthma. Also breast feeding   Symptoms Nasal discharge: yes Fever: no Sore throat: mild Cough: mild Wheezing: mild Ear pain: yes GI symptoms: no Sick contacts: yes; teacher  Red Flags  Stiff neck: no Dyspnea: no Rash: no Swallowing difficulty: no  Sinusitis Risk Factors Headache/face pain: no Double sickening: no tooth pain: no  Allergy Risk Factors Sneezing: no Itchy scratchy throat: no Seasonal symptoms: no  Flu Risk Factors Headache: no muscle aches: no severe fatigue: no   Past Medical History  Diagnosis Date  . Asthma   . Abnormal Pap smear   . PONV (postoperative nausea and vomiting)     Past Surgical History  Procedure Date  . Wisdom tooth extraction   . No past surgeries   . Tubes in ears   . Cesarean section 09/12/2012    Procedure: CESAREAN SECTION;  Surgeon: Philip Aspen, DO;  Location: [facility];  Service: Obstetrics;  Laterality: N/A;    Family History  Problem Relation Age of Onset  . Hypertension Father     History  Substance Use Topics  . Smoking status: Never Smoker   . Smokeless tobacco: Not on file  . Alcohol Use: Yes     Comment: socially    OB History    Grav Para Term Preterm Abortions TAB SAB Ect Mult Living   1 1 1  0 0 0 0 0 0 1      Review of Systems  All other systems reviewed and are negative.    Allergies  Sulfa antibiotics  Home Medications   Current Outpatient Rx  Name  Route  Sig  Dispense  Refill  . NORETHINDRON-ETHINYL ESTRAD-FE 1-20/1-30/1-35 MG-MCG PO TABS   Oral   Take 1 tablet by mouth daily.         . AZITHROMYCIN 250 MG PO TABS      Take 2 tabs PO x 1 dose,  then 1 tab PO QD x 4 days   6 tablet   0   . OXYCODONE-ACETAMINOPHEN 5-325 MG PO TABS   Oral   Take 1-2 tablets by mouth every 4 (four) hours as needed (moderate - severe pain).   30 tablet   0   . PRENATAL MULTIVITAMIN CH   Oral   Take 1 tablet by mouth every morning.           BP 117/70  Pulse 94  Temp 98.3 F (36.8 C) (Oral)  Resp 16  Ht 5\' 9"  (1.753 m)  Wt 143 lb (64.864 kg)  BMI 21.12 kg/m2  SpO2 96%  Breastfeeding? Yes  Physical Exam  Constitutional: She appears well-developed and well-nourished.  HENT:  Head: Normocephalic and atraumatic.  Right Ear: External ear normal.  Left Ear: External ear normal.       +nasal erythema, rhinorrhea bilaterally, + post oropharyngeal erythema    Eyes: Conjunctivae normal are normal. Pupils are equal, round, and reactive to light.  Neck: Normal range of motion. Neck supple.  Cardiovascular: Normal rate, regular rhythm and normal heart sounds.   Pulmonary/Chest: Effort normal. She has wheezes.  Abdominal: Soft.  Musculoskeletal: Normal range of motion.  Lymphadenopathy:    She has cervical adenopathy.  Neurological: She is alert.  Skin: Skin is warm.    ED Course  Procedures (including critical care time)  Labs Reviewed - No data to display No results found.   1. URI (upper respiratory infection)   2. Asthma       MDM  Likley viral process Discussed CXR. Pt declined this.  Depomedrol 80mg  IM x1 given wheezing.  zpak for atypical coverage.  Discussed breast feeding implications with inpatient pharmacy in terms of medication and with pt. Both are safe.  Discussed infectious and resp red flags.  Otherwise follow up as needed.     The patient and/or caregiver has been counseled thoroughly with regard to treatment plan and/or medications prescribed including dosage, schedule, interactions, rationale for use, and possible side effects and they verbalize understanding. Diagnoses and expected course of recovery  discussed and will return if not improved as expected or if the condition worsens. Patient and/or caregiver verbalized understanding.            Doree Albee, MD 12/19/12 1455

## 2012-12-19 NOTE — ED Notes (Signed)
Patient c/o productive cough and sinus congestion (green) with left ear pain x 10 days intermittent. Taken dayquil, IBF and tylenol. She is currently nursing her newborn.

## 2013-12-13 ENCOUNTER — Encounter: Payer: Self-pay | Admitting: Emergency Medicine

## 2013-12-13 ENCOUNTER — Emergency Department (INDEPENDENT_AMBULATORY_CARE_PROVIDER_SITE_OTHER)
Admission: EM | Admit: 2013-12-13 | Discharge: 2013-12-13 | Disposition: A | Discharge status: Home / Self Care | Payer: 59 | Source: Home / Self Care

## 2013-12-13 DIAGNOSIS — J069 Acute upper respiratory infection, unspecified: Secondary | ICD-10-CM

## 2013-12-13 DIAGNOSIS — R062 Wheezing: Secondary | ICD-10-CM

## 2013-12-13 MED ORDER — AZITHROMYCIN 250 MG PO TABS: ORAL_TABLET | ORAL | Status: DC

## 2013-12-13 MED ORDER — PREDNISONE 50 MG PO TABS: ORAL_TABLET | ORAL | Status: DC

## 2013-12-13 NOTE — ED Provider Notes (Signed)
CSN: [id number]     Arrival date & time 12/13/13  1153 History   None    Chief Complaint  Patient presents with  . Wheezing    x 1 week  . Cough    x 1 week  . Generalized Body Aches    x 1 week    HPI  URI Symptoms Onset: 7-10 days  Description: rhinorrhea, nasal congestion, cough, wheezing  Modifying factors:  Baseline asthmatic, increased albuterol use   Symptoms Nasal discharge: yes Fever: no Sore throat: no Cough: yes Wheezing: yes Ear pain: no GI symptoms: no Sick contacts: yes; multiple with similar sxs   Red Flags  Stiff neck: no Dyspnea: no Rash: no Swallowing difficulty: no  Sinusitis Risk Factors Headache/face pain: no Double sickening: no tooth pain: no  Allergy Risk Factors Sneezing: no Itchy scratchy throat: no Seasonal symptoms: no  Flu Risk Factors Headache: no muscle aches: no severe fatigue: no    Past Medical History  Diagnosis Date  . Asthma   . Abnormal Pap smear   . PONV (postoperative nausea and vomiting)    Past Surgical History  Procedure Laterality Date  . Wisdom tooth extraction    . No past surgeries    . Tubes in ears    . Cesarean section  09/12/2012    Procedure: CESAREAN SECTION;  Surgeon: Philip Aspen, DO;  Location: [facility];  Service: Obstetrics;  Laterality: N/A;   Family History  Problem Relation Age of Onset  . Hypertension Father    History  Substance Use Topics  . Smoking status: Never Smoker   . Smokeless tobacco: Not on file  . Alcohol Use: Yes     Comment: socially   OB History   Grav Para Term Preterm Abortions TAB SAB Ect Mult Living   1 1 1  0 0 0 0 0 0 1     Review of Systems  All other systems reviewed and are negative.    Allergies  Sulfa antibiotics  Home Medications   Current Outpatient Rx  Name  Route  Sig  Dispense  Refill  . azithromycin (ZITHROMAX) 250 MG tablet      Take 2 tabs PO x 1 dose, then 1 tab PO QD x 4 days   6 tablet   0   . norethindrone-ethinyl  estradiol-iron (ESTROSTEP FE,TILIA FE,TRI-LEGEST FE) 1-20/1-30/1-35 MG-MCG tablet   Oral   Take 1 tablet by mouth daily.         Marland Kitchen oxyCODONE-acetaminophen (PERCOCET/ROXICET) 5-325 MG per tablet   Oral   Take 1-2 tablets by mouth every 4 (four) hours as needed (moderate - severe pain).   30 tablet   0   . predniSONE (DELTASONE) 50 MG tablet      1 tab daily x 5 days   5 tablet   0   . Prenatal Vit-Fe Fumarate-FA (PRENATAL MULTIVITAMIN) TABS   Oral   Take 1 tablet by mouth every morning.          BP 120/79  Pulse 103  Temp(Src) 98.8 F (37.1 C) (Oral)  Ht 5\' 9"  (1.753 m)  Wt 140 lb (63.504 kg)  BMI 20.67 kg/m2  SpO2 97% Physical Exam  Constitutional: She is oriented to person, place, and time. She appears well-developed and well-nourished.  HENT:  Head: Normocephalic and atraumatic.  Right Ear: External ear normal.  Left Ear: External ear normal.  +nasal erythema, rhinorrhea bilaterally, + post oropharyngeal erythema    Eyes: Conjunctivae are normal.  Pupils are equal, round, and reactive to light.  Neck: Normal range of motion. Neck supple.  Cardiovascular: Normal rate, regular rhythm and normal heart sounds.   Pulmonary/Chest: Effort normal.  Mild wheezing in bases    Abdominal: Soft.  Musculoskeletal: Normal range of motion.  Neurological: She is alert and oriented to person, place, and time.  Skin: Skin is warm.    ED Course  Procedures (including critical care time) Labs Review Labs Reviewed - No data to display Imaging Review No results found.  EKG Interpretation    Date/Time:    Ventricular Rate:    PR Interval:    QRS Duration:   QT Interval:    QTC Calculation:   R Axis:     Text Interpretation:              MDM   1. URI (upper respiratory infection)   2. Wheezing    Likley viral URI with secondary asthma exacerbation.  Will place on course of prednisone for treatment  Zpak for lower resp/atypical coverage.  Discussed  supportive care and infectious/resp red flags  Follow up as needed.     The patient and/or caregiver has been counseled thoroughly with regard to treatment plan and/or medications prescribed including dosage, schedule, interactions, rationale for use, and possible side effects and they verbalize understanding. Diagnoses and expected course of recovery discussed and will return if not improved as expected or if the condition worsens. Patient and/or caregiver verbalized understanding.         Doree Albee, MD 12/13/13 [phone]

## 2013-12-13 NOTE — ED Notes (Signed)
Stacy Goodman complains of wheezing, cough, body aches, sore throat, runny nose, congestion and shortness of breath for 1 week. Worse over the past two days.

## 2014-09-09 ENCOUNTER — Ambulatory Visit: Payer: 59 | Admitting: Family Medicine

## 2014-09-09 ENCOUNTER — Telehealth: Payer: Self-pay | Admitting: *Deleted

## 2014-09-09 DIAGNOSIS — Z0289 Encounter for other administrative examinations: Secondary | ICD-10-CM

## 2014-09-09 NOTE — Telephone Encounter (Signed)
Needs appt

## 2014-09-09 NOTE — Telephone Encounter (Signed)
Pt did not show for appointment 09/09/2014 at 1:30pm to establish care

## 2014-09-10 NOTE — Telephone Encounter (Signed)
Appointment has already been scheduled for 02/14/15

## 2014-10-18 ENCOUNTER — Encounter: Payer: Self-pay | Admitting: Emergency Medicine

## 2014-11-25 ENCOUNTER — Encounter: Payer: Self-pay | Admitting: Family Medicine

## 2014-12-22 ENCOUNTER — Inpatient Hospital Stay (HOSPITAL_COMMUNITY): Payer: 59 | Admitting: Anesthesiology

## 2014-12-22 ENCOUNTER — Encounter (HOSPITAL_COMMUNITY): Payer: Self-pay | Admitting: *Deleted

## 2014-12-22 ENCOUNTER — Inpatient Hospital Stay (HOSPITAL_COMMUNITY): Payer: 59 | Admitting: Certified Registered"

## 2014-12-22 ENCOUNTER — Inpatient Hospital Stay (HOSPITAL_COMMUNITY)
Admission: AD | Admit: 2014-12-22 | Discharge: 2014-12-24 | DRG: 766 | Disposition: A | Discharge status: Home / Self Care | Payer: 59 | Source: Ambulatory Visit | Attending: Obstetrics | Admitting: Obstetrics

## 2014-12-22 ENCOUNTER — Encounter (HOSPITAL_COMMUNITY)
Admission: AD | Disposition: A | Discharge status: Home / Self Care | Payer: Self-pay | Source: Ambulatory Visit | Attending: Obstetrics

## 2014-12-22 ENCOUNTER — Other Ambulatory Visit (HOSPITAL_COMMUNITY): Payer: 59

## 2014-12-22 DIAGNOSIS — O321XX0 Maternal care for breech presentation, not applicable or unspecified: Principal | ICD-10-CM | POA: Diagnosis present

## 2014-12-22 DIAGNOSIS — O329XX0 Maternal care for malpresentation of fetus, unspecified, not applicable or unspecified: Secondary | ICD-10-CM | POA: Diagnosis present

## 2014-12-22 DIAGNOSIS — O3421 Maternal care for scar from previous cesarean delivery: Secondary | ICD-10-CM | POA: Diagnosis present

## 2014-12-22 DIAGNOSIS — Z3A39 39 weeks gestation of pregnancy: Secondary | ICD-10-CM | POA: Diagnosis present

## 2014-12-22 LAB — TYPE AND SCREEN
ABO/RH(D): O POS
ANTIBODY SCREEN: NEGATIVE

## 2014-12-22 LAB — CBC
HCT: 38.5 % (ref 36.0–46.0)
HEMOGLOBIN: 12.9 g/dL (ref 12.0–15.0)
MCH: 30.2 pg (ref 26.0–34.0)
MCHC: 33.5 g/dL (ref 30.0–36.0)
MCV: 90.2 fL (ref 78.0–100.0)
PLATELETS: 208 10*3/uL (ref 150–400)
RBC: 4.27 MIL/uL (ref 3.87–5.11)
RDW: 13.7 % (ref 11.5–15.5)
WBC: 13.8 10*3/uL — AB (ref 4.0–10.5)

## 2014-12-22 LAB — RPR

## 2014-12-22 SURGERY — Surgical Case
Anesthesia: Regional

## 2014-12-22 MED ORDER — DIPHENHYDRAMINE HCL 50 MG/ML IJ SOLN: 12.5000 mg | INTRAMUSCULAR | Status: DC | PRN

## 2014-12-22 MED ORDER — SENNOSIDES-DOCUSATE SODIUM 8.6-50 MG PO TABS
2.0000 | ORAL_TABLET | ORAL | Status: DC
  Administered 2014-12-23 – 2014-12-24 (×2): 2 via ORAL
  Filled 2014-12-22 (×2): qty 2

## 2014-12-22 MED ORDER — NALBUPHINE HCL 10 MG/ML IJ SOLN: 5.0000 mg | INTRAMUSCULAR | Status: DC | PRN

## 2014-12-22 MED ORDER — SIMETHICONE 80 MG PO CHEW
80.0000 mg | CHEWABLE_TABLET | Freq: Three times a day (TID) | ORAL | Status: DC
  Administered 2014-12-22 – 2014-12-23 (×3): 80 mg via ORAL
  Filled 2014-12-22 (×3): qty 1

## 2014-12-22 MED ORDER — ACETAMINOPHEN 500 MG PO TABS
1000.0000 mg | ORAL_TABLET | Freq: Four times a day (QID) | ORAL | Status: DC | PRN
  Administered 2014-12-22: 1000 mg via ORAL
  Filled 2014-12-22: qty 2

## 2014-12-22 MED ORDER — FENTANYL CITRATE 0.05 MG/ML IJ SOLN: 25.0000 ug | INTRAMUSCULAR | Status: DC | PRN

## 2014-12-22 MED ORDER — PRENATAL MULTIVITAMIN CH
1.0000 | ORAL_TABLET | Freq: Every day | ORAL | Status: DC
  Administered 2014-12-23: 1 via ORAL
  Filled 2014-12-22: qty 1

## 2014-12-22 MED ORDER — OXYTOCIN 10 UNIT/ML IJ SOLN
40.0000 [IU] | INTRAMUSCULAR | Status: DC | PRN
  Administered 2014-12-22: 40 [IU] via INTRAVENOUS

## 2014-12-22 MED ORDER — MORPHINE SULFATE (PF) 0.5 MG/ML IJ SOLN
INTRAMUSCULAR | Status: DC | PRN
  Administered 2014-12-22: .15 mg via INTRATHECAL

## 2014-12-22 MED ORDER — GLYCOPYRROLATE 0.2 MG/ML IJ SOLN
INTRAMUSCULAR | Status: AC
  Filled 2014-12-22: qty 1

## 2014-12-22 MED ORDER — KETOROLAC TROMETHAMINE 30 MG/ML IJ SOLN: 30.0000 mg | Freq: Four times a day (QID) | INTRAMUSCULAR | Status: DC | PRN

## 2014-12-22 MED ORDER — SODIUM CHLORIDE 0.9 % IJ SOLN: 3.0000 mL | INTRAMUSCULAR | Status: DC | PRN

## 2014-12-22 MED ORDER — PHENYLEPHRINE 8 MG IN D5W 100 ML (0.08MG/ML) PREMIX OPTIME
INJECTION | INTRAVENOUS | Status: DC | PRN
  Administered 2014-12-22: 60 ug/min via INTRAVENOUS

## 2014-12-22 MED ORDER — LACTATED RINGERS IV SOLN
INTRAVENOUS | Status: DC
  Administered 2014-12-22: 125 mL via INTRAVENOUS

## 2014-12-22 MED ORDER — LANOLIN HYDROUS EX OINT: 1.0000 "application " | TOPICAL_OINTMENT | CUTANEOUS | Status: DC | PRN

## 2014-12-22 MED ORDER — FAMOTIDINE IN NACL 20-0.9 MG/50ML-% IV SOLN
20.0000 mg | Freq: Once | INTRAVENOUS | Status: AC
  Administered 2014-12-22: 20 mg via INTRAVENOUS
  Filled 2014-12-22: qty 50

## 2014-12-22 MED ORDER — SIMETHICONE 80 MG PO CHEW: 80.0000 mg | CHEWABLE_TABLET | ORAL | Status: DC | PRN

## 2014-12-22 MED ORDER — BUPIVACAINE IN DEXTROSE 0.75-8.25 % IT SOLN
INTRATHECAL | Status: DC | PRN
  Administered 2014-12-22: 1.6 mL via INTRATHECAL

## 2014-12-22 MED ORDER — NALOXONE HCL 1 MG/ML IJ SOLN
1.0000 ug/kg/h | INTRAVENOUS | Status: DC | PRN
  Filled 2014-12-22: qty 2

## 2014-12-22 MED ORDER — SIMETHICONE 80 MG PO CHEW
80.0000 mg | CHEWABLE_TABLET | ORAL | Status: DC
Start: 2014-12-23 — End: 2014-12-24
  Administered 2014-12-23 – 2014-12-24 (×2): 80 mg via ORAL
  Filled 2014-12-22 (×2): qty 1

## 2014-12-22 MED ORDER — OXYTOCIN 10 UNIT/ML IJ SOLN
INTRAMUSCULAR | Status: AC
  Filled 2014-12-22: qty 4

## 2014-12-22 MED ORDER — KETOROLAC TROMETHAMINE 30 MG/ML IJ SOLN
INTRAMUSCULAR | Status: AC
  Administered 2014-12-22: 30 mg via INTRAMUSCULAR
  Filled 2014-12-22: qty 1

## 2014-12-22 MED ORDER — MORPHINE SULFATE 0.5 MG/ML IJ SOLN
INTRAMUSCULAR | Status: AC
  Filled 2014-12-22: qty 10

## 2014-12-22 MED ORDER — DIBUCAINE 1 % RE OINT: 1.0000 "application " | TOPICAL_OINTMENT | RECTAL | Status: DC | PRN

## 2014-12-22 MED ORDER — SCOPOLAMINE 1 MG/3DAYS TD PT72
MEDICATED_PATCH | TRANSDERMAL | Status: AC
  Filled 2014-12-22: qty 1

## 2014-12-22 MED ORDER — LACTATED RINGERS IV SOLN
INTRAVENOUS | Status: DC
  Administered 2014-12-22 (×2): via INTRAVENOUS

## 2014-12-22 MED ORDER — TETANUS-DIPHTH-ACELL PERTUSSIS 5-2.5-18.5 LF-MCG/0.5 IM SUSP
0.5000 mL | Freq: Once | INTRAMUSCULAR | Status: AC
  Administered 2014-12-23: 0.5 mL via INTRAMUSCULAR

## 2014-12-22 MED ORDER — NALBUPHINE HCL 10 MG/ML IJ SOLN
INTRAMUSCULAR | Status: AC
  Administered 2014-12-22: 5 mg via SUBCUTANEOUS
  Filled 2014-12-22: qty 1

## 2014-12-22 MED ORDER — WITCH HAZEL-GLYCERIN EX PADS: 1.0000 "application " | MEDICATED_PAD | CUTANEOUS | Status: DC | PRN

## 2014-12-22 MED ORDER — NALOXONE HCL 0.4 MG/ML IJ SOLN: 0.4000 mg | INTRAMUSCULAR | Status: DC | PRN

## 2014-12-22 MED ORDER — LACTATED RINGERS IV SOLN
INTRAVENOUS | Status: DC | PRN
  Administered 2014-12-22: 09:00:00 via INTRAVENOUS

## 2014-12-22 MED ORDER — CEFAZOLIN SODIUM-DEXTROSE 2-3 GM-% IV SOLR
2.0000 g | INTRAVENOUS | Status: AC
  Administered 2014-12-22: 2 g via INTRAVENOUS
  Filled 2014-12-22: qty 50

## 2014-12-22 MED ORDER — CITRIC ACID-SODIUM CITRATE 334-500 MG/5ML PO SOLN
30.0000 mL | Freq: Once | ORAL | Status: AC
  Administered 2014-12-22: 30 mL via ORAL
  Filled 2014-12-22: qty 15

## 2014-12-22 MED ORDER — FENTANYL CITRATE 0.05 MG/ML IJ SOLN
INTRAMUSCULAR | Status: AC
  Filled 2014-12-22: qty 2

## 2014-12-22 MED ORDER — NALBUPHINE HCL 10 MG/ML IJ SOLN
5.0000 mg | Freq: Once | INTRAMUSCULAR | Status: AC | PRN
  Administered 2014-12-22: 5 mg via SUBCUTANEOUS

## 2014-12-22 MED ORDER — MENTHOL 3 MG MT LOZG: 1.0000 | LOZENGE | OROMUCOSAL | Status: DC | PRN

## 2014-12-22 MED ORDER — OXYCODONE-ACETAMINOPHEN 5-325 MG PO TABS
1.0000 | ORAL_TABLET | ORAL | Status: DC | PRN
  Administered 2014-12-22 – 2014-12-23 (×4): 1 via ORAL
  Filled 2014-12-22 (×4): qty 1

## 2014-12-22 MED ORDER — PHENYLEPHRINE HCL 10 MG/ML IJ SOLN
INTRAMUSCULAR | Status: DC | PRN
  Administered 2014-12-22: 80 ug via INTRAVENOUS
  Administered 2014-12-22 (×3): 40 ug via INTRAVENOUS

## 2014-12-22 MED ORDER — DIPHENHYDRAMINE HCL 25 MG PO CAPS: 25.0000 mg | ORAL_CAPSULE | Freq: Four times a day (QID) | ORAL | Status: DC | PRN

## 2014-12-22 MED ORDER — SCOPOLAMINE 1 MG/3DAYS TD PT72: 1.0000 | MEDICATED_PATCH | Freq: Once | TRANSDERMAL | Status: DC

## 2014-12-22 MED ORDER — ONDANSETRON HCL 4 MG PO TABS: 4.0000 mg | ORAL_TABLET | ORAL | Status: DC | PRN

## 2014-12-22 MED ORDER — KETOROLAC TROMETHAMINE 30 MG/ML IJ SOLN
30.0000 mg | Freq: Four times a day (QID) | INTRAMUSCULAR | Status: DC | PRN
  Administered 2014-12-22: 30 mg via INTRAMUSCULAR

## 2014-12-22 MED ORDER — ONDANSETRON HCL 4 MG/2ML IJ SOLN
INTRAMUSCULAR | Status: AC
  Filled 2014-12-22: qty 2

## 2014-12-22 MED ORDER — ONDANSETRON HCL 4 MG/2ML IJ SOLN: 4.0000 mg | Freq: Three times a day (TID) | INTRAMUSCULAR | Status: DC | PRN

## 2014-12-22 MED ORDER — OXYTOCIN 40 UNITS IN LACTATED RINGERS INFUSION - SIMPLE MED: 62.5000 mL/h | INTRAVENOUS | Status: AC

## 2014-12-22 MED ORDER — OXYCODONE-ACETAMINOPHEN 5-325 MG PO TABS
2.0000 | ORAL_TABLET | ORAL | Status: DC | PRN
  Administered 2014-12-23 – 2014-12-24 (×4): 2 via ORAL
  Filled 2014-12-22 (×4): qty 2

## 2014-12-22 MED ORDER — MEPERIDINE HCL 25 MG/ML IJ SOLN: 6.2500 mg | INTRAMUSCULAR | Status: DC | PRN

## 2014-12-22 MED ORDER — GLYCOPYRROLATE 0.2 MG/ML IJ SOLN
INTRAMUSCULAR | Status: DC | PRN
  Administered 2014-12-22: 0.2 mg via INTRAVENOUS

## 2014-12-22 MED ORDER — PHENYLEPHRINE 8 MG IN D5W 100 ML (0.08MG/ML) PREMIX OPTIME
INJECTION | INTRAVENOUS | Status: AC
  Filled 2014-12-22: qty 100

## 2014-12-22 MED ORDER — FENTANYL CITRATE 0.05 MG/ML IJ SOLN
INTRAMUSCULAR | Status: DC | PRN
  Administered 2014-12-22: 25 ug via INTRATHECAL

## 2014-12-22 MED ORDER — ONDANSETRON HCL 4 MG/2ML IJ SOLN: 4.0000 mg | INTRAMUSCULAR | Status: DC | PRN

## 2014-12-22 MED ORDER — IBUPROFEN 600 MG PO TABS
600.0000 mg | ORAL_TABLET | Freq: Four times a day (QID) | ORAL | Status: DC
  Administered 2014-12-22 – 2014-12-24 (×8): 600 mg via ORAL
  Filled 2014-12-22 (×8): qty 1

## 2014-12-22 MED ORDER — SCOPOLAMINE 1 MG/3DAYS TD PT72
MEDICATED_PATCH | TRANSDERMAL | Status: DC | PRN
  Administered 2014-12-22: 1 via TRANSDERMAL

## 2014-12-22 MED ORDER — ONDANSETRON HCL 4 MG/2ML IJ SOLN
INTRAMUSCULAR | Status: DC | PRN
  Administered 2014-12-22: 4 mg via INTRAVENOUS

## 2014-12-22 MED ORDER — DIPHENHYDRAMINE HCL 25 MG PO CAPS: 25.0000 mg | ORAL_CAPSULE | ORAL | Status: DC | PRN

## 2014-12-22 MED ORDER — NALBUPHINE HCL 10 MG/ML IJ SOLN: 5.0000 mg | Freq: Once | INTRAMUSCULAR | Status: AC | PRN

## 2014-12-22 SURGICAL SUPPLY — 37 items
BENZOIN TINCTURE PRP APPL 2/3 (GAUZE/BANDAGES/DRESSINGS) ×3 IMPLANT
CLAMP CORD UMBIL (MISCELLANEOUS) IMPLANT
CLOSURE WOUND 1/2 X4 (GAUZE/BANDAGES/DRESSINGS) ×1
CLOTH BEACON ORANGE TIMEOUT ST (SAFETY) ×3 IMPLANT
DRAPE SHEET LG 3/4 BI-LAMINATE (DRAPES) IMPLANT
DRSG OPSITE POSTOP 4X10 (GAUZE/BANDAGES/DRESSINGS) ×3 IMPLANT
DURAPREP 26ML APPLICATOR (WOUND CARE) ×3 IMPLANT
ELECT REM PT RETURN 9FT ADLT (ELECTROSURGICAL) ×3
ELECTRODE REM PT RTRN 9FT ADLT (ELECTROSURGICAL) ×1 IMPLANT
EXTRACTOR VACUUM KIWI (MISCELLANEOUS) IMPLANT
GLOVE BIO SURGEON STRL SZ 6 (GLOVE) ×3 IMPLANT
GLOVE BIOGEL M 6.5 STRL (GLOVE) ×3 IMPLANT
GLOVE BIOGEL M 7.0 STRL (GLOVE) ×3 IMPLANT
GLOVE INDICATOR 6.5 STRL GRN (GLOVE) ×3 IMPLANT
GOWN STRL REUS W/TWL LRG LVL3 (GOWN DISPOSABLE) ×6 IMPLANT
KIT ABG SYR 3ML LUER SLIP (SYRINGE) IMPLANT
LIQUID BAND (GAUZE/BANDAGES/DRESSINGS) IMPLANT
NEEDLE HYPO 25X5/8 SAFETYGLIDE (NEEDLE) IMPLANT
NS IRRIG 1000ML POUR BTL (IV SOLUTION) ×3 IMPLANT
PACK C SECTION WH (CUSTOM PROCEDURE TRAY) ×3 IMPLANT
PAD OB MATERNITY 4.3X12.25 (PERSONAL CARE ITEMS) ×3 IMPLANT
RETRACTOR WND ALEXIS 25 LRG (MISCELLANEOUS) ×1 IMPLANT
RTRCTR C-SECT PINK 25CM LRG (MISCELLANEOUS) IMPLANT
RTRCTR WOUND ALEXIS 25CM LRG (MISCELLANEOUS) ×3
STRIP CLOSURE SKIN 1/2X4 (GAUZE/BANDAGES/DRESSINGS) ×2 IMPLANT
SUT MNCRL 0 VIOLET CTX 36 (SUTURE) ×2 IMPLANT
SUT MNCRL AB 3-0 PS2 27 (SUTURE) ×3 IMPLANT
SUT MONOCRYL 0 CTX 36 (SUTURE) ×4
SUT PLAIN 0 NONE (SUTURE) IMPLANT
SUT PLAIN 2 0 (SUTURE) ×2
SUT PLAIN ABS 2-0 CT1 27XMFL (SUTURE) ×1 IMPLANT
SUT VIC AB 0 CTX 36 (SUTURE) ×4
SUT VIC AB 0 CTX36XBRD ANBCTRL (SUTURE) ×2 IMPLANT
SUT VIC AB 2-0 CT1 27 (SUTURE) ×2
SUT VIC AB 2-0 CT1 TAPERPNT 27 (SUTURE) ×1 IMPLANT
TOWEL OR 17X24 6PK STRL BLUE (TOWEL DISPOSABLE) ×3 IMPLANT
TRAY FOLEY CATH 14FR (SET/KITS/TRAYS/PACK) ×3 IMPLANT

## 2014-12-22 NOTE — Progress Notes (Signed)
T&S still pending.  R/B/A reviewed.  Low risk for hemorrhage.  Given active labor, decision made to proceed to OR prior at this time prior to completion of T&S.

## 2014-12-22 NOTE — Anesthesia Preprocedure Evaluation (Signed)
Anesthesia Evaluation  Patient identified by MRN, date of birth, ID band Patient awake    Reviewed: Allergy & Precautions, H&P , NPO status , Patient's Chart, lab work & pertinent test results  History of Anesthesia Complications (+) PONV and history of anesthetic complications  Airway Mallampati: II       Dental   Pulmonary asthma ,  breath sounds clear to auscultation        Cardiovascular Exercise Tolerance: Good Rhythm:regular Rate:Normal     Neuro/Psych    GI/Hepatic   Endo/Other    Renal/GU      Musculoskeletal   Abdominal   Peds  Hematology   Anesthesia Other Findings   Reproductive/Obstetrics (+) Pregnancy                             Anesthesia Physical  Anesthesia Plan  ASA: II and emergent  Anesthesia Plan: Spinal   Post-op Pain Management:    Induction:   Airway Management Planned:   Additional Equipment:   Intra-op Plan:   Post-operative Plan:   Informed Consent: I have reviewed the patients History and Physical, chart, labs and discussed the procedure including the risks, benefits and alternatives for the proposed anesthesia with the patient or authorized representative who has indicated his/her understanding and acceptance.     Plan Discussed with: Anesthesiologist, CRNA and Surgeon  Anesthesia Plan Comments:         Anesthesia Quick Evaluation

## 2014-12-22 NOTE — Lactation Note (Signed)
This note was copied from the chart of Stacy Goodman. Lactation Consultation Note Initial visit at 9 hours of age.   Mom is changing baby from left to right breast after a 20 minute feeding.   Baby latches well in cross cradle and then mom holds in cradle hold comfortable to her.  Baby has wide flanged lips.  [facility] given and discussed.  Encouraged to feed with early cues on demand.  Early newborn behavior discussed.  Hand expression done by mom with colostrum visible prior to feeding.  Discussed future plans for pumping she will be out of work for 12 weeks.  Mom to call for assist as needed.    Patient Name: Stacy Goodman JXBJY'NToday's Date: 12/22/2014 Reason for consult: Initial assessment   Maternal Data Has patient been taught Hand Expression?: Yes Does the patient have breastfeeding experience prior to this delivery?: Yes  Feeding Feeding Type: Breast Fed Length of feed:  (several minutes observed)  LATCH Score/Interventions Latch: Grasps breast easily, tongue down, lips flanged, rhythmical sucking.  Audible Swallowing: A few with stimulation  Type of Nipple: Everted at rest and after stimulation  Comfort (Breast/Nipple): Soft / non-tender     Hold (Positioning): No assistance needed to correctly position infant at breast.  LATCH Score: 9  Lactation Tools Discussed/Used     Consult Status Consult Status: Follow-up Date: 12/23/14 Follow-up type: In-patient    Beverely RisenShoptaw, Arvella MerlesJana Lynn 12/22/2014, 6:39 PM

## 2014-12-22 NOTE — Anesthesia Preprocedure Evaluation (Deleted)
Anesthesia Evaluation  Patient identified by MRN, date of birth, ID band Patient awake    Reviewed: Allergy & Precautions, H&P , NPO status , Patient's Chart, lab work & pertinent test results  History of Anesthesia Complications (+) PONV and history of anesthetic complications  Airway Mallampati: II       Dental   Pulmonary asthma ,  breath sounds clear to auscultation        Cardiovascular Exercise Tolerance: Good Rhythm:regular Rate:Normal     Neuro/Psych    GI/Hepatic   Endo/Other    Renal/GU      Musculoskeletal   Abdominal   Peds  Hematology   Anesthesia Other Findings   Reproductive/Obstetrics (+) Pregnancy                             Anesthesia Physical  Anesthesia Plan  ASA: II and emergent  Anesthesia Plan: Spinal   Post-op Pain Management:    Induction:   Airway Management Planned:   Additional Equipment:   Intra-op Plan:   Post-operative Plan:   Informed Consent: I have reviewed the patients History and Physical, chart, labs and discussed the procedure including the risks, benefits and alternatives for the proposed anesthesia with the patient or authorized representative who has indicated his/her understanding and acceptance.     Plan Discussed with: Anesthesiologist, CRNA and Surgeon  Anesthesia Plan Comments:         Anesthesia Quick Evaluation  

## 2014-12-22 NOTE — H&P (Signed)
34 y.o. G2P1001 @ 2941w0d presents with c/o contractions since 0400.  Otherwise has good fetal movement and no bleeding.  Past Medical History  Diagnosis Date  . Asthma   . Abnormal Pap smear   . PONV (postoperative nausea and vomiting)     Past Surgical History  Procedure Laterality Date  . Wisdom tooth extraction    . No past surgeries    . Tubes in ears    . Cesarean section  09/12/2012    Procedure: CESAREAN SECTION;  Surgeon: Philip AspenSidney Callahan, DO;  Location: [facility];  Service: Obstetrics;  Laterality: N/A;    OB History  Gravida Para Term Preterm AB SAB TAB Ectopic Multiple Living  2 1 1  0 0 0 0 0 0 1    # Outcome Date GA Lbr Len/2nd Weight Sex Delivery Anes PTL Lv  2 Current           1 Term 09/12/12 5530w3d  3.921 kg (8 lb 10.3 oz) M CS-LTranv Spinal  Y      History   Social History  . Marital Status: Married    Spouse Name: N/A    Number of Children: N/A  . Years of Education: N/A   Occupational History  . Not on file.   Social History Main Topics  . Smoking status: Never Smoker   . Smokeless tobacco: Not on file  . Alcohol Use: Yes     Comment: socially  . Drug Use: No  . Sexual Activity: Yes    Birth Control/ Protection: None   Other Topics Concern  . Not on file   Social History Narrative   Sulfa antibiotics    Prenatal Transfer Tool  Maternal Diabetes: No Genetic Screening: Normal Maternal Ultrasounds/Referrals: None  Fetal Ultrasounds or other Referrals:  None Maternal Substance Abuse:  No Significant Maternal Medications:  Meds include: Other: valtrex Significant Maternal Lab Results: Lab values include: Group B Strep negative, Other:    Other PNC: uncomplicated.    Filed Vitals:   12/22/14 0730  BP: 124/77  Pulse: 89  Temp: 98.7 F (37.1 C)  Resp: 18     General:  NAD Lungs: CTAB Cardiac: RRR Abdomen:  soft, gravid Ex:  none edema SVE:  4/70/-2 FHTs:  140s, mod var Toco:  q777min   A/P   34 y.o. 4141w0d  G2P1001 presents with  labor  FSR/ vtx/ GBS neg.  Discussed r/b/a to cesarean section to include risks of infection, bleeding, damage to surrounding structures (including bladder, bowel, tubes, ovaries, nerves, vessels, baby), risk of blood clot, need for transfusion, need for additional procedures.  Consent was signed.   [place], [facility] LPDYANNA

## 2014-12-22 NOTE — Brief Op Note (Signed)
12/22/2014  11:09 AM  PATIENT:  Stacy Goodman  34 y.o. female  PRE-OPERATIVE DIAGNOSIS:  CESAREAN SECTION  POST-OPERATIVE DIAGNOSIS:  CESAREAN SECTION  PROCEDURE:  Procedure(s): CESAREAN SECTION (N/A)  SURGEON:  Surgeon(s) and Role:    * Marlow Baarsyanna Jesselee Poth, MD - Primary    * Essie HartWalda Pinn, MD - Assisting  ANESTHESIA:   spinal  EBL:  Total I/O In: 2300 [I.V.:2300] Out: 700 [Urine:200; Blood:500]  BLOOD ADMINISTERED:none  DRAINS: none   LOCAL MEDICATIONS USED:  NONE  SPECIMEN:  Source of Specimen:  placenta  DISPOSITION OF SPECIMEN:  L&D  COUNTS:  YES  TOURNIQUET:  * No tourniquets in log *  DICTATION: .Note written in EPIC  PLAN OF CARE: Admit to inpatient   PATIENT DISPOSITION:  PACU - hemodynamically stable.   Delay start of Pharmacological VTE agent (>24hrs) due to surgical blood loss or risk of bleeding: not applicable

## 2014-12-22 NOTE — Anesthesia Procedure Notes (Signed)
Spinal Patient location during procedure: OR Start time: 12/22/2014 9:13 AM Staffing Anesthesiologist: Brayton CavesJACKSON, Nihaal Friesen Performed by: anesthesiologist  Preanesthetic Checklist Completed: patient identified, site marked, surgical consent, pre-op evaluation, timeout performed, IV checked, risks and benefits discussed and monitors and equipment checked Spinal Block Patient position: sitting Prep: DuraPrep Patient monitoring: heart rate, cardiac monitor, continuous pulse ox and blood pressure Approach: midline Location: L3-4 Injection technique: single-shot Needle Needle type: Sprotte  Needle gauge: 24 G Needle length: 9 cm Assessment Sensory level: T4 Additional Notes Patient identified.  Risk benefits discussed including failed block, incomplete pain control, headache, nerve damage, paralysis, blood pressure changes, nausea, vomiting, reactions to medication both toxic or allergic, and postpartum back pain.  Patient expressed understanding and wished to proceed.  All questions were answered.  Sterile technique used throughout procedure.  CSF was clear.  No parasthesia or other complications.  Please see nursing notes for vital signs.

## 2014-12-22 NOTE — Transfer of Care (Signed)
Immediate Anesthesia Transfer of Care Note  Patient: Sonny MastersMichelle R Guest  Procedure(s) Performed: Procedure(s): CESAREAN SECTION (N/A)  Patient Location: PACU  Anesthesia Type:Spinal  Level of Consciousness: awake, alert  and oriented  Airway & Oxygen Therapy: Patient Spontanous Breathing  Post-op Assessment: Report given to PACU RN and Post -op Vital signs reviewed and stable  Post vital signs: Reviewed and stable  Complications: No apparent anesthesia complications

## 2014-12-22 NOTE — Progress Notes (Signed)
To OR via Doctor, general practicetretcher

## 2014-12-22 NOTE — MAU Note (Signed)
UC's since 0400. No bleeding or leaking. Patient states the baby is breech and is scheduled for C/S on Friday, 1/8. Has had a previous C/S, but would have VBAC'd if not breech.

## 2014-12-22 NOTE — Anesthesia Postprocedure Evaluation (Signed)
  Anesthesia Post-op Note  Patient: Stacy Goodman  Procedure(s) Performed: Procedure(s): CESAREAN SECTION (N/A)  Patient Location: PACU and Mother/Baby  Anesthesia Type:Spinal  Level of Consciousness: awake, alert  and oriented  Airway and Oxygen Therapy: Patient Spontanous Breathing  Post-op Pain: mild  Post-op Assessment: Post-op Vital signs reviewed, Patient's Cardiovascular Status Stable, Respiratory Function Stable, Patent Airway, Adequate PO intake, Pain level controlled and No headache  Post-op Vital Signs: Reviewed and stable  Last Vitals:  Filed Vitals:   12/22/14 1600  BP: 122/60  Pulse: 68  Temp: 36.9 C  Resp: 16    Complications: No apparent anesthesia complications

## 2014-12-22 NOTE — Anesthesia Postprocedure Evaluation (Signed)
Anesthesia Post Note  Patient: Stacy Goodman  Procedure(s) Performed: Procedure(s) (LRB): CESAREAN SECTION (N/A)  Anesthesia type: Spinal  Patient location: PACU  Post pain: Pain level controlled  Post assessment: Post-op Vital signs reviewed  Last Vitals:  Filed Vitals:   12/22/14 1130  BP: 102/62  Pulse: 58  Temp: 36.6 C  Resp: 19    Post vital signs: Reviewed  Level of consciousness: awake  Complications: No apparent anesthesia complications

## 2014-12-22 NOTE — MAU Note (Signed)
Call to nursery, house coverage, L&D circulator

## 2014-12-22 NOTE — Addendum Note (Signed)
Addendum  created 12/22/14 1636 by Elbert Ewingsolleen S Jalesa Thien, CRNA   Modules edited: Notes Section   Notes Section:  File: [id number]

## 2014-12-22 NOTE — Op Note (Signed)
Cesarean Section Procedure Note  Pre-operative Diagnosis: 1. Intrauterine pregnancy at 9865w0d  2. Breech presentation 3. Prior cesarean delivery  Post-operative Diagnosis: same as above  Surgeon: Marlow Baarsyanna Joliyah Lippens, MD  Assistants: Essie HartWalda Pinn, MD  Procedure: Repeat low transverse cesarean section   Anesthesia: Spinal anesthesia  Estimated Blood Loss: 500 mL         Drains: Foley catheter         Specimens: Placenta to L&D         Implants: none         Complications:  None; patient tolerated the procedure well.         Disposition: PACU - hemodynamically stable.  Findings:  Normal uterus, tubes and ovaries bilaterally.  Viable female infant, 3751g (8lb 4oz) Apgars 9, 9.    Procedure Details   After spinalanesthesia was found to adequate , the patient was placed in the dorsal supine position with a leftward tilt, draped and prepped in the usual sterile manner. A Pfannenstiel incision was made and carried down through the subcutaneous tissue to the fascia. The fascia was incised in the midline and the fascial incision was extended laterally with Mayo scissors. The superior aspect of the fascial incision was grasped with two Kocher clamp, tented up and the rectus muscles dissected off sharply. The rectus was then dissected off with blunt dissection and Mayo scissors inferiorly. The rectus muscles were separated in the midline. The abdominal peritoneum was identified, tented up, entered bluntly, and the incision was extended superiorly and inferiorly with good visualization of the bladder. The Alexis retractor was deployed. The vesicouterine peritoneum was identified, tented up, entered sharply, and the bladder flap was created digitally. Scalpel was then used to make a low transverse incision on the uterus which was extended in the cephalad-caudad direction with blunt dissection. The fluid was clear. The fetal breech was identified, elevated out of the pelvis and brought to the hysterotomy. The  baby was delivered from a Frank breech position via the usual maneuvers without difficulty. The cord was clamped and cut and the infant was passed to the waiting neonatologist. Placenta was then delivered spontaneously, intact and appear normal, the uterus was cleared of all clot and debris   The hysterotomy was repaired with #0 Monocryl in running locked fashion.  Multiple additional figure of eight suture was placed for hemostasis.   TThe hysterotomy was reexamined and excellent hemostasis was noted.  The Alexis retractor was removed from the abdomen. The peritoneum was examined and all vessels noted to be hemostatic. The abdominal cavity was cleared of all clot and debris.  The peritoneum was closed with 2-0 vicryl in a running fashion.  A figure of eight suture reapproximated the rectus diastasis. The fascia and rectus muscles were inspected and were hemostatic. The fascia was closed with 0 Vicryl in a running fashion. The subcuticular layer was irrigated and all bleeders cauterized.  At this time, the upper edge of the incision was grasped with 3 Allis clamps.  The keloid scar was removed with the scalpel and discarded. The subcutaneous layer was re approximated with interrupted 3-0 plain gut.  The skin was closed with 3-0 monocryl in a subcuticular fashion. The incision was dressed with benzoine, steri strips and pressure dressing. All sponge lap and needle counts were correct x3. Patient tolerated the procedure well and recovered in stable condition following the procedure.

## 2014-12-23 ENCOUNTER — Other Ambulatory Visit (HOSPITAL_COMMUNITY): Payer: 59

## 2014-12-23 ENCOUNTER — Encounter (HOSPITAL_COMMUNITY): Payer: Self-pay | Admitting: Obstetrics

## 2014-12-23 LAB — CBC
HCT: 30.9 % — ABNORMAL LOW (ref 36.0–46.0)
Hemoglobin: 10.5 g/dL — ABNORMAL LOW (ref 12.0–15.0)
MCH: 30.4 pg (ref 26.0–34.0)
MCHC: 34 g/dL (ref 30.0–36.0)
MCV: 89.6 fL (ref 78.0–100.0)
Platelets: 191 10*3/uL (ref 150–400)
RBC: 3.45 MIL/uL — ABNORMAL LOW (ref 3.87–5.11)
RDW: 13.7 % (ref 11.5–15.5)
WBC: 11.1 10*3/uL — AB (ref 4.0–10.5)

## 2014-12-23 LAB — BIRTH TISSUE RECOVERY COLLECTION (PLACENTA DONATION)

## 2014-12-23 NOTE — Lactation Note (Signed)
This note was copied from the chart of Stacy Goodman. Lactation Consultation Note  Baby latched upon entering the room.   Mother denies soreness, problems or questions. Suggest she massage breast to keep her active. Mother states baby has been cluster feeding during the day. Suggest she call if she needs assistance.  Patient Name: Stacy Goodman [id number]'s Date: 12/23/2014 Reason for consult: Follow-up assessment   Maternal Data    Feeding Feeding Type: Breast Fed  LATCH Score/Interventions Latch: Grasps breast easily, tongue down, lips flanged, rhythmical sucking. (latched upon entering the room)  Audible Swallowing: A few with stimulation  Type of Nipple: Everted at rest and after stimulation  Comfort (Breast/Nipple): Soft / non-tender     Hold (Positioning): No assistance needed to correctly position infant at breast.  LATCH Score: 9  Lactation Tools Discussed/Used     Consult Status Consult Status: Follow-up Date: 12/24/14 Follow-up type: In-patient    Dahlia ByesBerkelhammer, [facility] 12/23/2014, 3:01 PM

## 2014-12-23 NOTE — Progress Notes (Signed)
Subjective: Postpartum Day 1: Cesarean Delivery Patient reports doing well, adequate pain control, tolerating po, ambulating without diffulty  Objective: Vital signs in last 24 hours: Filed Vitals:   12/22/14 2005 12/23/14 0050 12/23/14 0400 12/23/14 0815  BP: 111/59 102/46 104/45 111/58  Pulse: 59 61 60 58  Temp: 98.5 F (36.9 C) 97.7 F (36.5 C)  98.3 F (36.8 C)  TempSrc: Oral Oral  Oral  Resp: 18 18 16 18   Height:      Weight:      SpO2: 95% 95% 96% 98%    Physical Exam:  General: alert, cooperative and appears stated age 22Lochia: appropriate Uterine Fundus: firm Incision: healing well DVT Evaluation: No evidence of DVT seen on physical exam.   Recent Labs  12/22/14 0758 12/23/14 0550  HGB 12.9 10.5*  HCT 38.5 30.9*    Assessment/Plan: Status post Cesarean section. Doing well postoperatively.  Anticipate D/C home tomorrow Continue current care.  Melanie Pellot H. 12/23/2014, 11:15 AM

## 2014-12-24 ENCOUNTER — Inpatient Hospital Stay (HOSPITAL_COMMUNITY): Admission: AD | Admit: 2014-12-24 | Payer: 59 | Source: Ambulatory Visit | Admitting: Obstetrics and Gynecology

## 2014-12-24 SURGERY — Surgical Case
Anesthesia: Regional

## 2014-12-24 MED ORDER — OXYCODONE-ACETAMINOPHEN 5-325 MG PO TABS: 2.0000 | ORAL_TABLET | ORAL | Status: AC | PRN

## 2014-12-24 NOTE — Progress Notes (Signed)
Dr. Junie Bameallhan notified that there were no staples to remove, even though there was a staple removal order. New honeycomb applied and MD notified.

## 2014-12-24 NOTE — Progress Notes (Signed)
POD#2 Pt without complaints.  VSSAF IMP/ doing well Plan/Routine care.

## 2014-12-24 NOTE — Lactation Note (Signed)
This note was copied from the chart of Stacy Goodman. Lactation Consultation Note  8% weight loss.   Mother latched baby in football hold.  Noisy latch but then mother flanged lips and seal and suck improved. Encouraged parents to monitor voids/stools and watch for sucks/swallows.  Mother's breasts are filling, Her nipples are tender.  Provided comfort gels and discussed applying ebm. Mother started on birth control pills with older child and her milk supply decreased.  Discussed medications that decrease milk supply and encouraged her to call if she has questions. Mother plans to start pumping at 3 weeks to boost her milk supply.   Suggest she may want to start sooner and give baby back volume pumped with a spoon. Encouraged her to attend support group or call if she needs further assistance.   Patient Name: Stacy Goodman [id number]'s Date: 12/24/2014 Reason for consult: Follow-up assessment   Maternal Data    Feeding Feeding Type: Breast Fed Length of feed: 30 min  LATCH Score/Interventions Latch: Grasps breast easily, tongue down, lips flanged, rhythmical sucking. Intervention(s): Breast massage  Audible Swallowing: Spontaneous and intermittent Intervention(s): Alternate breast massage;Hand expression;Skin to skin  Type of Nipple: Everted at rest and after stimulation  Comfort (Breast/Nipple): Filling, red/small blisters or bruises, mild/mod discomfort  Problem noted: Filling;Mild/Moderate discomfort Interventions (Mild/moderate discomfort): Comfort gels  Hold (Positioning): No assistance needed to correctly position infant at breast.  LATCH Score: 9  Lactation Tools Discussed/Used     Consult Status Consult Status: Follow-up Date: 12/25/14 Follow-up type: In-patient    Dahlia ByesBerkelhammer, [facility] 12/24/2014, 10:35 AM

## 2014-12-24 NOTE — Discharge Summary (Signed)
Obstetric Discharge Summary Reason for Admission: onset of labor Prenatal Procedures: ultrasound Intrapartum Procedures: breech extraction and cesarean: low cervical, transverse Postpartum Procedures: none Complications-Operative and Postpartum: none HEMOGLOBIN  Date Value Ref Range Status  12/23/2014 10.5* 12.0 - 15.0 g/dL Final    Comment:    DELTA CHECK NOTED REPEATED TO VERIFY    HCT  Date Value Ref Range Status  12/23/2014 30.9* 36.0 - 46.0 % Final    Physical Exam:  General: alert Lochia: appropriate Uterine Fundus: firm Incision: healing well DVT Evaluation: No evidence of DVT seen on physical exam.  Discharge Diagnoses: Term Pregnancy-delivered and Breech and h.o. C Section  Discharge Information: Date: 12/24/2014 Activity: pelvic rest Diet: routine Medications: PNV, Ibuprofen and Percocet Condition: stable Instructions: refer to practice specific booklet Discharge to: home Follow-up Information    Follow up with Marlow BaarsLARK, DYANNA, MD. Schedule an appointment as soon as possible for a visit in 1 month.   Specialty:  Obstetrics   Contact information:   [address] [place] [address] [phone]       Newborn Data: Live born female  Birth Weight: 8 lb 4.3 oz (3751 g) APGAR: 9, 9  Home with mother.  ANDERSON,MARK E 12/24/2014, 10:35 AM

## 2015-02-14 ENCOUNTER — Ambulatory Visit: Payer: 59 | Admitting: Family Medicine
